# Patient Record
Sex: Male | Born: 1965 | Race: Black or African American | Hispanic: No | Marital: Single | Smoking: Former smoker
Health system: Southern US, Community
[De-identification: ages and names within clinical notes are randomized; demographics above are authoritative.]

## PROBLEM LIST (undated history)

## (undated) DIAGNOSIS — M5441 Lumbago with sciatica, right side: Secondary | ICD-10-CM

## (undated) DIAGNOSIS — B20 Human immunodeficiency virus [HIV] disease: Secondary | ICD-10-CM

## (undated) DIAGNOSIS — B009 Herpesviral infection, unspecified: Secondary | ICD-10-CM

## (undated) DIAGNOSIS — E119 Type 2 diabetes mellitus without complications: Secondary | ICD-10-CM

## (undated) DIAGNOSIS — G8929 Other chronic pain: Secondary | ICD-10-CM

## (undated) DIAGNOSIS — Z21 Asymptomatic human immunodeficiency virus [HIV] infection status: Secondary | ICD-10-CM

## (undated) DIAGNOSIS — I10 Essential (primary) hypertension: Secondary | ICD-10-CM

---

## 2011-06-01 DIAGNOSIS — B009 Herpesviral infection, unspecified: Secondary | ICD-10-CM | POA: Insufficient documentation

## 2011-06-01 DIAGNOSIS — Z227 Latent tuberculosis: Secondary | ICD-10-CM | POA: Insufficient documentation

## 2011-11-22 DIAGNOSIS — M545 Low back pain, unspecified: Secondary | ICD-10-CM | POA: Insufficient documentation

## 2014-01-19 DIAGNOSIS — I10 Essential (primary) hypertension: Secondary | ICD-10-CM | POA: Insufficient documentation

## 2015-12-10 ENCOUNTER — Encounter: Payer: Self-pay | Admitting: *Deleted

## 2015-12-10 ENCOUNTER — Emergency Department
Admission: EM | Admit: 2015-12-10 | Discharge: 2015-12-10 | Disposition: A | Discharge status: Home / Self Care | Payer: Self-pay | Source: Home / Self Care | Attending: Family Medicine | Admitting: Family Medicine

## 2015-12-10 DIAGNOSIS — R030 Elevated blood-pressure reading, without diagnosis of hypertension: Secondary | ICD-10-CM

## 2015-12-10 DIAGNOSIS — R3589 Other polyuria: Secondary | ICD-10-CM

## 2015-12-10 DIAGNOSIS — IMO0001 Reserved for inherently not codable concepts without codable children: Secondary | ICD-10-CM

## 2015-12-10 DIAGNOSIS — R358 Other polyuria: Secondary | ICD-10-CM

## 2015-12-10 DIAGNOSIS — R631 Polydipsia: Secondary | ICD-10-CM

## 2015-12-10 DIAGNOSIS — E119 Type 2 diabetes mellitus without complications: Secondary | ICD-10-CM

## 2015-12-10 HISTORY — DX: Herpesviral infection, unspecified: B00.9

## 2015-12-10 HISTORY — DX: Human immunodeficiency virus (HIV) disease: B20

## 2015-12-10 HISTORY — DX: Asymptomatic human immunodeficiency virus (hiv) infection status: Z21

## 2015-12-10 HISTORY — DX: Essential (primary) hypertension: I10

## 2015-12-10 LAB — POCT URINALYSIS DIP (MANUAL ENTRY)
Bilirubin, UA: NEGATIVE
Blood, UA: NEGATIVE
Glucose, UA: 500 — AB
Leukocytes, UA: NEGATIVE
Nitrite, UA: NEGATIVE
Protein Ur, POC: NEGATIVE
Spec Grav, UA: <=1.005 (ref 1.005–1.03)
Urobilinogen, UA: 0.2 (ref 0–1)
pH, UA: 5 (ref 5–8)

## 2015-12-10 LAB — POCT CBC W AUTO DIFF (K'VILLE URGENT CARE)

## 2015-12-10 LAB — POCT FASTING CBG KUC MANUAL ENTRY

## 2015-12-10 MED ORDER — METFORMIN HCL 500 MG PO TABS: 500.0000 mg | ORAL_TABLET | Freq: Two times a day (BID) | ORAL | Status: DC

## 2015-12-10 NOTE — ED Notes (Signed)
Pt c/o urinary frequency and increased thirst x 1 wk. Denies dysuria.

## 2015-12-10 NOTE — ED Provider Notes (Signed)
CSN: [id number]     Arrival date & time 12/10/15  1456 History   First MD Initiated Contact with Patient 12/10/15 1502     Chief Complaint  Patient presents with  . Urinary Frequency   (Consider location/radiation/quality/duration/timing/severity/associated sxs/prior Treatment) HPI  The pt is a 50yo male presenting to [facility] with concern he might have diabetes. Pt reports having increased thirst and urination for the last 1 week. Pt states he will urinate for "like 5 minutes" then have to go away within a few minutes.  He has a hx of HIV and f/u with ID regularly.  Per [facility]where, his last BMP showed normal kidney function and blood glucose of 133.  Pt believes his sister may have diabetes but unsure about the rest of his family.  Denies fever, chills, chest pain, SOB, abdominal pain, nausea, n/v/d. He does report having 3 iced teas PTA while at lunch.  Past Medical History  Diagnosis Date  . HIV (human immunodeficiency virus infection) (HCC)   . Herpes   . Hypertension    History reviewed. No pertinent past surgical history. Family History  Problem Relation Age of Onset  . Hypertension Mother   . Hypertension Father   . Hypertension Sister   . Hypertension Brother    Social History  Substance Use Topics  . Smoking status: Current Every Day Smoker    Types: Cigarettes  . Smokeless tobacco: None  . Alcohol Use: Yes     Comment: 1 q wk    Review of Systems  Constitutional: Negative for fever and chills.  HENT: Negative for congestion, ear pain, sore throat, trouble swallowing and voice change.   Respiratory: Negative for cough and shortness of breath.   Cardiovascular: Negative for chest pain and palpitations.  Gastrointestinal: Negative for nausea, vomiting, abdominal pain and diarrhea.  Endocrine: Positive for polydipsia and polyuria. Negative for polyphagia.  Musculoskeletal: Negative for myalgias, back pain and arthralgias.  Skin: Negative for rash.    Allergies   Review of patient's allergies indicates no known allergies.  Home Medications   Prior to Admission medications   Medication Sig Start Date End Date Taking? Authorizing Provider  Emtricitab-Rilpivir-Tenofovir (COMPLERA PO) Take by mouth.   Yes Historical Provider, MD  hydrochlorothiazide (HYDRODIURIL) 25 MG tablet Take 25 mg by mouth daily.   Yes Historical Provider, MD  valACYclovir (VALTREX) 1000 MG tablet Take 1,000 mg by mouth 2 (two) times daily.   Yes Historical Provider, MD  metFORMIN (GLUCOPHAGE) 500 MG tablet Take 1 tablet (500 mg total) by mouth 2 (two) times daily with a meal. 12/10/15   Junius Finner, PA-C   Meds Ordered and Administered this Visit  Medications - No data to display  BP 155/110 mmHg  Pulse 89  Temp(Src) 98.2 F (36.8 C) (Oral)  Resp 18  Ht  (1.88 m)  Wt 235 lb (106.595 kg)  BMI 30.16 kg/m2  SpO2 96% No data found.   Physical Exam  Constitutional: He appears well-developed and well-nourished.  HENT:  Head: Normocephalic and atraumatic.  Mouth/Throat: Oropharynx is clear and moist.  Eyes: Conjunctivae are normal. No scleral icterus.  Neck: Normal range of motion. Neck supple.  Cardiovascular: Normal rate, regular rhythm and normal heart sounds.   Pulmonary/Chest: Effort normal and breath sounds normal. No respiratory distress. He has no wheezes. He has no rales. He exhibits no tenderness.  Abdominal: Soft. He exhibits no distension. There is no tenderness.  Musculoskeletal: Normal range of motion.  Neurological: He is alert.  Skin: Skin is warm and dry.  Nursing note and vitals reviewed.   ED Course  Procedures (including critical care time)  Labs Review Labs Reviewed  POCT URINALYSIS DIP (MANUAL ENTRY) - Abnormal; Notable for the following:    Glucose, UA =500 (*)    Ketones, POC UA trace (5) (*)    All other components within normal limits  BASIC METABOLIC PANEL WITH GFR  HEMOGLOBIN A1C  POCT FASTING CBG [facility] MANUAL ENTRY  POCT CBC  W AUTO DIFF ([facility])    Imaging Review No results found.   MDM   1. Type 2 diabetes mellitus without complication, without long-term current use of insulin (HCC)   2. Polyuria   3. Polydipsia   4. Elevated blood pressure    Pt c/o polyuria and polydipsia for 1 week.  Elevated BP, hx of HTN.    CBG: >600 UA: glucose = 500, trace ketones  Labs c/w Type II DM Will start pt on Metformin, labs from Feb 2017 pt had normal Creatine  Will send off BMP and Hgb A1c  Encouraged to call Internal Medicine at [facility], where he sees ID, to schedule a f/u appointment to discuss new medication- metformin, and to review today's labs.  Several information packets on Diabetes and diet/exercise provided. Stressed importance for close f/u.  Patient verbalized understanding and agreement with treatment plan.     Junius Finnerrin O'Malley, PA-C 12/10/15 [phone]

## 2015-12-10 NOTE — Discharge Instructions (Signed)
Please be sure to follow up with Internal Medicine at [facility] to discuss today's labs with your sugar being over 600 and sugar seen in your urine.  You should also discuss the new medication, Metformin, which you have been started on today for your high sugar.    Labs including a BMP (to check your kidney function and electrolytes) as well as a Hgb A1c (to check your average blood sugar over the last 90 days) have been sent to the lab.  Those should come back in the next 2-3 days and you will be called with today's results.  Please see below for more information about Type 2 Diabetes.  It is important to have close follow up to help prevent any long term side effects of diabetes.  It is likely your diabetes can be treated with diet and exercises, however, some patients will eventually need to be placed on insulin.  Please review today's information to help you with questions you may have for your next visit with Internal Medicine.

## 2015-12-11 ENCOUNTER — Telehealth: Payer: Self-pay | Admitting: *Deleted

## 2015-12-11 DIAGNOSIS — E119 Type 2 diabetes mellitus without complications: Secondary | ICD-10-CM | POA: Insufficient documentation

## 2015-12-11 LAB — BASIC METABOLIC PANEL WITH GFR
BUN: 16 mg/dL (ref 7–25)
CO2: 21 mmol/L (ref 20–31)
Calcium: 8.8 mg/dL (ref 8.6–10.3)
Chloride: 93 mmol/L — ABNORMAL LOW (ref 98–110)
Creat: 1.11 mg/dL (ref 0.70–1.33)
GFR, Est African American: 89 mL/min (ref >=60)
GFR, Est Non African American: 77 mL/min (ref >=60)
Glucose, Bld: 868 mg/dL (ref 65–99)
Potassium: 5 mmol/L (ref 3.5–5.3)
Sodium: 125 mmol/L — ABNORMAL LOW (ref 135–146)

## 2015-12-11 LAB — HEMOGLOBIN A1C
Hgb A1c MFr Bld: 10.7 % — ABNORMAL HIGH (ref <5.7)
Mean Plasma Glucose: 260 mg/dL

## 2016-08-23 ENCOUNTER — Emergency Department (HOSPITAL_BASED_OUTPATIENT_CLINIC_OR_DEPARTMENT_OTHER)
Admission: EM | Admit: 2016-08-23 | Discharge: 2016-08-23 | Disposition: A | Discharge status: Home / Self Care | Payer: Self-pay | Attending: Emergency Medicine | Admitting: Emergency Medicine

## 2016-08-23 ENCOUNTER — Encounter (HOSPITAL_BASED_OUTPATIENT_CLINIC_OR_DEPARTMENT_OTHER): Payer: Self-pay | Admitting: Emergency Medicine

## 2016-08-23 ENCOUNTER — Emergency Department (HOSPITAL_BASED_OUTPATIENT_CLINIC_OR_DEPARTMENT_OTHER): Payer: Self-pay

## 2016-08-23 DIAGNOSIS — Z7984 Long term (current) use of oral hypoglycemic drugs: Secondary | ICD-10-CM | POA: Insufficient documentation

## 2016-08-23 DIAGNOSIS — Z21 Asymptomatic human immunodeficiency virus [HIV] infection status: Secondary | ICD-10-CM | POA: Insufficient documentation

## 2016-08-23 DIAGNOSIS — M25551 Pain in right hip: Secondary | ICD-10-CM | POA: Insufficient documentation

## 2016-08-23 DIAGNOSIS — Z79899 Other long term (current) drug therapy: Secondary | ICD-10-CM | POA: Insufficient documentation

## 2016-08-23 DIAGNOSIS — E119 Type 2 diabetes mellitus without complications: Secondary | ICD-10-CM | POA: Insufficient documentation

## 2016-08-23 DIAGNOSIS — F17210 Nicotine dependence, cigarettes, uncomplicated: Secondary | ICD-10-CM | POA: Insufficient documentation

## 2016-08-23 DIAGNOSIS — I10 Essential (primary) hypertension: Secondary | ICD-10-CM | POA: Insufficient documentation

## 2016-08-23 HISTORY — DX: Type 2 diabetes mellitus without complications: E11.9

## 2016-08-23 MED ORDER — KETOROLAC TROMETHAMINE 30 MG/ML IJ SOLN
30.0000 mg | Freq: Once | INTRAMUSCULAR | Status: AC
Start: 2016-08-23 — End: 2016-08-23
  Administered 2016-08-23: 30 mg via INTRAMUSCULAR
  Filled 2016-08-23: qty 1

## 2016-08-23 MED ORDER — HYDROCODONE-ACETAMINOPHEN 5-325 MG PO TABS: 1.0000 | ORAL_TABLET | ORAL | 0 refills | Status: DC | PRN

## 2016-08-23 NOTE — ED Triage Notes (Signed)
Patient reports that he has had pain to his right hip for about 1 month. Today it is unbearable. Denies any Trauma or injury

## 2016-08-23 NOTE — Discharge Instructions (Signed)
You hip x-ray was normal. This is likely musculoskeletal pain or arthritic pain. Take the pain medicine as needed. I am giving UA referral to the orthopedist upstairs. Follow-up with them. He may also follow-up with his primary care doctor. Return to the ED if you develop any worsening symptoms.

## 2016-08-23 NOTE — ED Notes (Signed)
States has had right hip pain at times for the past month. Prior episode of similar pain to left shoulder. Denies recent injury

## 2016-08-25 NOTE — ED Provider Notes (Signed)
[facility]   CSN: [id number] Arrival date & time: 08/23/16  1345     History   Chief Complaint Chief Complaint  Patient presents with  . Hip Pain    HPI Maurice Turner is a 51 y.o. male.  Patient presents with no sig pmh for right hip pain. Patient states the pain started approx 5 weeks ago. The pain is intermittent. Moving makes the pain worse at times. Resting makes the pain better. He has not used any otc medication. Denies any trauma or injury. Hx of same pain in his right shoulder that he saw an orthopedists who gave him a steroid injection that helped. Denies any fever, chills, lower extremitiy edema, parathesias, weakness, loss of bowel or bladder, urinary retention, saddle paresthesias.      Past Medical History:  Diagnosis Date  . Diabetes mellitus without complication (HCC)   . Herpes   . HIV (human immunodeficiency virus infection) (HCC)   . Hypertension     There are no active problems to display for this patient.   History reviewed. No pertinent surgical history.     Home Medications    Prior to Admission medications   Medication Sig Start Date End Date Taking? Authorizing Provider  Emtricitab-Rilpivir-Tenofovir (COMPLERA PO) Take by mouth.    Historical Provider, MD  hydrochlorothiazide (HYDRODIURIL) 25 MG tablet Take 25 mg by mouth daily.    Historical Provider, MD  HYDROcodone-acetaminophen (NORCO/VICODIN) 5-325 MG tablet Take 1-2 tablets by mouth every 4 (four) hours as needed. 08/23/16   Rise Mu, PA-C  metFORMIN (GLUCOPHAGE) 500 MG tablet Take 1 tablet (500 mg total) by mouth 2 (two) times daily with a meal. 12/10/15   Junius Finner, PA-C  valACYclovir (VALTREX) 1000 MG tablet Take 1,000 mg by mouth 2 (two) times daily.    Historical Provider, MD    Family History Family History  Problem Relation Age of Onset  . Hypertension Mother   . Hypertension Father   . Hypertension Sister   . Hypertension Brother      Social History Social History  Substance Use Topics  . Smoking status: Current Every Day Smoker    Types: Cigarettes  . Smokeless tobacco: Never Used  . Alcohol use Yes     Comment: 1 q wk     Allergies   Patient has no known allergies.   Review of Systems Review of Systems  Constitutional: Negative for chills and fever.  Respiratory: Negative for cough and shortness of breath.   Cardiovascular: Negative for chest pain and leg swelling.  Gastrointestinal: Negative for nausea and vomiting.  Genitourinary: Negative for difficulty urinating.  Musculoskeletal: Positive for arthralgias. Negative for back pain and gait problem.  Skin: Negative for color change.  Neurological: Negative for weakness and numbness.  All other systems reviewed and are negative.    Physical Exam Updated Vital Signs BP 131/86 (BP Location: Right Arm)   Pulse (!) 58   Temp 98 F (36.7 C) (Oral)   Resp 18   Ht 6\' 2"  (1.88 m)   Wt 97.5 kg   SpO2 98%   BMI 27.60 kg/m   Physical Exam  Constitutional: He appears well-developed and well-nourished. No distress.  Eyes: Right eye exhibits no discharge. Left eye exhibits no discharge. No scleral icterus.  Cardiovascular: Intact distal pulses.   Pulmonary/Chest: No respiratory distress.  Musculoskeletal: Normal range of motion.       Right hip: He exhibits tenderness and bony tenderness. He exhibits normal  range of motion, normal strength, no swelling, no crepitus and no deformity.  Strength 5/5. Cap refill normal. Full ROM. Able to ambulate. Dp pulses are 2+ bilat. No tenderness to midline L spine tenderness. FUll ROM.  Neurological: He is alert.  Reflex Scores:      Patellar reflexes are 2+ on the right side and 2+ on the left side.      Achilles reflexes are 2+ on the right side and 2+ on the left side. Skin: Skin is warm and dry. Capillary refill takes less than 2 seconds. No pallor.  Nursing note and vitals reviewed.    ED Treatments /  Results  Labs (all labs ordered are listed, but only abnormal results are displayed) Labs Reviewed - No data to display  EKG  EKG Interpretation None       Radiology Dg Hip Unilat W Or Wo Pelvis 2-3 Views Right  Result Date: 08/23/2016 CLINICAL DATA:  Right hip pain.  No injury. EXAM: DG HIP (WITH OR WITHOUT PELVIS) 2-3V RIGHT COMPARISON:  None. FINDINGS: Mild joint space narrowing in the hip joints bilaterally. SI joints are symmetric and unremarkable. No acute bony abnormality. Specifically, no fracture, subluxation, or dislocation. Soft tissues are intact. IMPRESSION: No acute bony abnormality. Electronically Signed   By: Charlett NoseKevin  Dover M.D.   On: 08/23/2016 14:17    Procedures Procedures (including critical care time)  Medications Ordered in ED Medications  ketorolac (TORADOL) 30 MG/ML injection 30 mg (30 mg Intramuscular Given 08/23/16 1501)     Initial Impression / Assessment and Plan / ED Course  I have reviewed the triage vital signs and the nursing notes.  Pertinent labs & imaging results that were available during my care of the patient were reviewed by me and considered in my medical decision making (see chart for details).  Clinical Course   Patient X-Ray negative for obvious fracture or dislocation. Pain managed in ED. Pt advised to follow up with orthopedics if symptoms persist for possibility of missed fracture diagnosis. Patient given brace while in ED, conservative therapy recommended and discussed. Patient will be dc home & is agreeable with above plan.  Final Clinical Impressions(s) / ED Diagnoses   Final diagnoses:  Right hip pain    New Prescriptions Discharge Medication List as of 08/23/2016  2:57 PM    START taking these medications   Details  HYDROcodone-acetaminophen (NORCO/VICODIN) 5-325 MG tablet Take 1-2 tablets by mouth every 4 (four) hours as needed., Starting Sun 08/23/2016, Print         Rise MuKenneth T Orion Mole, PA-C 08/27/16 1348      Rolland PorterMark James, MD 09/10/16 [phone]

## 2016-08-27 ENCOUNTER — Ambulatory Visit (INDEPENDENT_AMBULATORY_CARE_PROVIDER_SITE_OTHER): Payer: BLUE CROSS/BLUE SHIELD | Admitting: Family Medicine

## 2016-08-27 ENCOUNTER — Encounter: Payer: Self-pay | Admitting: Family Medicine

## 2016-08-27 DIAGNOSIS — M5441 Lumbago with sciatica, right side: Secondary | ICD-10-CM

## 2016-08-27 MED ORDER — METHOCARBAMOL 500 MG PO TABS
500.0000 mg | ORAL_TABLET | Freq: Three times a day (TID) | ORAL | 1 refills | Status: DC | PRN
Start: 2016-08-27 — End: 2017-03-18

## 2016-08-27 MED ORDER — OXYCODONE-ACETAMINOPHEN 7.5-325 MG PO TABS: 1.0000 | ORAL_TABLET | Freq: Four times a day (QID) | ORAL | 0 refills | Status: DC | PRN

## 2016-08-27 MED ORDER — PREDNISONE 10 MG PO TABS: ORAL_TABLET | ORAL | 0 refills | Status: DC

## 2016-08-27 NOTE — Patient Instructions (Addendum)
You have lumbar radiculopathy (a pinched nerve in your low back). A prednisone dose pack is the best option for immediate relief and may be prescribed. Day after finishing prednisone start aleve 2 tabs twice a day with food for pain and inflammation. Oxycodone as needed for severe pain (no driving on this medicine). Robaxin as needed for muscle spasms (no driving on this medicine if it makes you sleepy). Stay as active as possible. Physical therapy has been shown to be helpful as well. Strengthening of low back muscles, abdominal musculature are key for long term pain relief. If not improving, will consider further imaging (MRI). Call me in a week to let me know how you're doing.

## 2016-08-31 ENCOUNTER — Telehealth: Payer: Self-pay | Admitting: Family Medicine

## 2016-08-31 DIAGNOSIS — B20 Human immunodeficiency virus [HIV] disease: Secondary | ICD-10-CM | POA: Insufficient documentation

## 2016-08-31 NOTE — Assessment & Plan Note (Signed)
history consistent with radiculopathy - only finding on exam is positive straight leg raise.  He will try prednisone dose pack with robaxin and oxycodone as needed.  Call us in a week for an update on his status.  Consider PT if improving, MRI if not improving.

## 2016-08-31 NOTE — Telephone Encounter (Signed)
Spoke to patient and he stated that he was doing better today (08-31-16). Stated he is taking the Prednisone. Told him that if not improving then we could do a MRI. Patient stated that he wanted to wait since he was not having pain today.

## 2016-08-31 NOTE — Telephone Encounter (Signed)
Is he taking the prednisone?  If he's taking the prednisone and still not improving then would consider imaging of his hip (MRI though if x-rays required before this he would need lumbar spine x-rays).

## 2016-08-31 NOTE — Progress Notes (Signed)
PCP: Pcp Not In System  Subjective:   HPI: Patient is a 51 y.o. male here for low back pain.  Patient reports he's had about 1 month of severe right sided low back, hip pain. Pain starts back on right side, worse with rotation. Can radiate down to right foot. Difficulty walking at times. Given toradol in ED which helped transiently. Pain is 10/10, sharp. Worse with squatting, bending, going from sitting to standing. Tried hydrocodone, ibuprofen. Occasional numbness down to knee. No bowel/bladder dysfunction. No skin changes.  Past Medical History:  Diagnosis Date  . Diabetes mellitus without complication (HCC)   . Herpes   . HIV (human immunodeficiency virus infection) (HCC)   . Hypertension     Current Outpatient Prescriptions on File Prior to Visit  Medication Sig Dispense Refill  . Emtricitab-Rilpivir-Tenofovir (COMPLERA PO) Take by mouth.    . hydrochlorothiazide (HYDRODIURIL) 25 MG tablet Take 25 mg by mouth daily.     No current facility-administered medications on file prior to visit.     No past surgical history on file.  No Known Allergies  Social History   Social History  . Marital status: Single    Spouse name: N/A  . Number of children: N/A  . Years of education: N/A   Occupational History  . Not on file.   Social History Main Topics  . Smoking status: Current Every Day Smoker    Types: Cigarettes  . Smokeless tobacco: Never Used  . Alcohol use Yes     Comment: 1 q wk  . Drug use:     Types: Marijuana  . Sexual activity: Not on file   Other Topics Concern  . Not on file   Social History Narrative  . No narrative on file    Family History  Problem Relation Age of Onset  . Hypertension Mother   . Hypertension Father   . Hypertension Sister   . Hypertension Brother     BP (!) 155/97   Pulse 62   Ht 6\' 2"  (1.88 m)   Wt 220 lb (99.8 kg)   BMI 28.25 kg/m   Review of Systems: See HPI above.     Objective:  Physical  Exam:  Gen: NAD, comfortable in exam room  Back: No gross deformity, scoliosis. No paraspinal TTP .  No midline or bony TTP. FROM with pain on trunk rotation. Strength LEs 5/5 all muscle groups.   2+ MSRs in patellar and achilles tendons, equal bilaterally. Positive SLR on right, negative left. Sensation intact to light touch bilaterally. Negative logroll bilateral hips Negative fabers and piriformis stretches.   Assessment & Plan:  1. Lumbar radiculopathy - history consistent with radiculopathy - only finding on exam is positive straight leg raise.  He will try prednisone dose pack with robaxin and oxycodone as needed.  Call us in a week for an update on his status.  Consider PT if improving, MRI if not improving.

## 2016-09-06 ENCOUNTER — Encounter (HOSPITAL_BASED_OUTPATIENT_CLINIC_OR_DEPARTMENT_OTHER): Payer: Self-pay | Admitting: *Deleted

## 2016-09-06 ENCOUNTER — Emergency Department (HOSPITAL_BASED_OUTPATIENT_CLINIC_OR_DEPARTMENT_OTHER)
Admission: EM | Admit: 2016-09-06 | Discharge: 2016-09-06 | Disposition: A | Discharge status: Home / Self Care | Payer: BLUE CROSS/BLUE SHIELD | Attending: Emergency Medicine | Admitting: Emergency Medicine

## 2016-09-06 DIAGNOSIS — Z7984 Long term (current) use of oral hypoglycemic drugs: Secondary | ICD-10-CM | POA: Insufficient documentation

## 2016-09-06 DIAGNOSIS — Z21 Asymptomatic human immunodeficiency virus [HIV] infection status: Secondary | ICD-10-CM | POA: Diagnosis not present

## 2016-09-06 DIAGNOSIS — M25551 Pain in right hip: Secondary | ICD-10-CM | POA: Diagnosis present

## 2016-09-06 DIAGNOSIS — F17210 Nicotine dependence, cigarettes, uncomplicated: Secondary | ICD-10-CM | POA: Insufficient documentation

## 2016-09-06 DIAGNOSIS — I10 Essential (primary) hypertension: Secondary | ICD-10-CM | POA: Insufficient documentation

## 2016-09-06 DIAGNOSIS — E119 Type 2 diabetes mellitus without complications: Secondary | ICD-10-CM | POA: Diagnosis not present

## 2016-09-06 MED ORDER — MELOXICAM 7.5 MG PO TABS
7.5000 mg | ORAL_TABLET | Freq: Every day | ORAL | Status: DC
  Filled 2016-09-06: qty 1

## 2016-09-06 MED ORDER — MELOXICAM 7.5 MG PO TABS: 15.0000 mg | ORAL_TABLET | Freq: Every day | ORAL | 0 refills | Status: DC

## 2016-09-06 NOTE — ED Triage Notes (Addendum)
Pt c/o right hip pain , brisk walk w/o difficulty to triage. Pt is requesting refill of  pain meds , seen here 2 weeks ago for same and f/u with [facility]

## 2016-09-06 NOTE — ED Provider Notes (Signed)
[facility] Note   CSN: [id number] Arrival date & time: 09/06/16  8657     History   Chief Complaint Chief Complaint  Patient presents with  . Hip Pain    HPI Maurice Turner is a 51 y.o. male.  Patient presents emergency department with chief complaint of right hip pain. He states he has had the pain for the past couple of months. He denies any known injuries. States that he has some pain that radiates from his back to the right hip. He was seen on 08/23/16 for the same, given pain medicine, antibiotics to follow-up with primary care. He denies any changes in his symptoms, but states that he has run out of his pain medication, and is requesting a refill. Denies any numbness, weakness, tingling, bowel or bladder incontinence. He has an appointment with Dr. Andree Coss tomorrow.    Hip Pain     Past Medical History:  Diagnosis Date  . Diabetes mellitus without complication (HCC)   . Herpes   . HIV (human immunodeficiency virus infection) (HCC)   . Hypertension     Patient Active Problem List   Diagnosis Date Noted  . HIV disease (HCC) 08/31/2016  . Type 2 diabetes mellitus without complication (HCC) 12/11/2015  . Essential hypertension with goal blood pressure less than 130/80 01/19/2014  . Low back pain 11/22/2011  . Herpes simplex type 2 infection 06/01/2011  . 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 No pertinent surgical history.     Home Medications    Prior to Admission medications   Medication Sig Start Date End Date Taking? Authorizing Provider  Emtricitab-Rilpivir-Tenofovir (COMPLERA PO) Take by mouth.    Historical Provider, MD  emtricitabine-rilpivir-tenofovir AF (ODEFSEY) 200-25-25 MG TABS tablet TAKE 1 TABLET BY MOUTH DAILY 05/25/16   Historical Provider, MD  hydrochlorothiazide (HYDRODIURIL) 25 MG tablet Take 25 mg by mouth daily.    Historical Provider, MD  lisinopril (PRINIVIL,ZESTRIL) 10 MG tablet Take 10  mg by mouth. 12/12/15 12/11/16  Historical Provider, MD  meloxicam (MOBIC) 7.5 MG tablet Take 2 tablets (15 mg total) by mouth daily. 09/06/16   Roxy Horseman, PA-C  metFORMIN (GLUCOPHAGE) 500 MG tablet Take 500 mg by mouth. 06/02/16   Historical Provider, MD  methocarbamol (ROBAXIN) 500 MG tablet Take 1 tablet (500 mg total) by mouth every 8 (eight) hours as needed. 08/27/16   Lenda Kelp, MD  oxyCODONE-acetaminophen (PERCOCET) 7.5-325 MG tablet Take 1 tablet by mouth every 6 (six) hours as needed for severe pain. 08/27/16   Lenda Kelp, MD  predniSONE (DELTASONE) 10 MG tablet 6 tabs po day 1, 5 tabs po day 2, 4 tabs po day 3, 3 tabs po day 4, 2 tabs po day 5, 1 tab po day 6 08/27/16   Lenda Kelp, MD  valACYclovir (VALTREX) 1000 MG tablet TAKE 1 TABLET BY MOUTH DAILY 08/19/16   Historical Provider, MD    Family History Family History  Problem Relation Age of Onset  . Hypertension Mother   . Hypertension Father   . Hypertension Sister   . Hypertension Brother     Social History Social History  Substance Use Topics  . Smoking status: Current Every Day Smoker    Types: Cigarettes  . Smokeless tobacco: Never Used  . Alcohol use Yes     Comment: 1 q wk     Allergies   Patient has no known allergies.   Review of Systems Review of  Systems  Musculoskeletal: Positive for arthralgias.  All other systems reviewed and are negative.    Physical Exam Updated Vital Signs BP 145/89   Pulse (!) 55   Temp 98.2 F (36.8 C)   Resp 18   Ht 6\' 2"  (1.88 m)   Wt 99.8 kg   SpO2 100%   BMI 28.25 kg/m   Physical Exam  Constitutional: He is oriented to person, place, and time. No distress.  HENT:  Head: Normocephalic and atraumatic.  Eyes: Conjunctivae and EOM are normal. Pupils are equal, round, and reactive to light.  Neck: No tracheal deviation present.  Cardiovascular: Normal rate.   Pulmonary/Chest: Effort normal. No respiratory distress.  Abdominal: Soft.    Musculoskeletal: Normal range of motion.  No difficulty ambulating ROM and strength of lower extremities is 5/5  Neurological: He is alert and oriented to person, place, and time.  Sensation intact  Skin: Skin is warm and dry. He is not diaphoretic.  Psychiatric: Judgment normal.  Nursing note and vitals reviewed.    ED Treatments / Results  Labs (all labs ordered are listed, but only abnormal results are displayed) Labs Reviewed - No data to display  EKG  EKG Interpretation None       Radiology No results found.  Procedures Procedures (including critical care time)  Medications Ordered in ED Medications  meloxicam (MOBIC) tablet 7.5 mg (not administered)     Initial Impression / Assessment and Plan / ED Course  I have reviewed the triage vital signs and the nursing notes.  Pertinent labs & imaging results that were available during my care of the patient were reviewed by me and considered in my medical decision making (see chart for details).  Clinical Course     Patient with right hip pain. Seen last week for the same. Has had pain for the past 6 weeks or so. Plain films from last week were negative. I'm much more suspicious of a lumbar radiculopathy versus sciatica. Patient has follow-up with sports medicine tomorrow. He is requesting narcotic pain medication today, threatened lawsuit if he does not get this medicine. I informed him that I'm happy to treat his pain as much as possible, but without narcotic pain medication. Will try Mobic. Advised patient not to take any other NSAIDs with this medication. I'm only giving him a one-week course since he will be seeing sports medicine tomorrow. I do not feel that narcotic pain medicine is indicated in this patient do not feel that it would be in the patient's overall interest and health.  Final Clinical Impressions(s) / ED Diagnoses   Final diagnoses:  Right hip pain    New Prescriptions New Prescriptions    MELOXICAM (MOBIC) 7.5 MG TABLET    Take 2 tablets (15 mg total) by mouth daily.     Roxy HorsemanRobert Thessaly Mccullers, PA-C 09/06/16 1027    Charlynne Panderavid Hsienta Yao, MD 09/06/16 2015

## 2016-09-06 NOTE — ED Notes (Signed)
amb w/o difficulty to checkout

## 2016-09-07 ENCOUNTER — Ambulatory Visit (HOSPITAL_BASED_OUTPATIENT_CLINIC_OR_DEPARTMENT_OTHER)
Admission: RE | Admit: 2016-09-07 | Discharge: 2016-09-07 | Disposition: A | Discharge status: Home / Self Care | Payer: BLUE CROSS/BLUE SHIELD | Source: Ambulatory Visit | Attending: Family Medicine | Admitting: Family Medicine

## 2016-09-07 ENCOUNTER — Encounter: Payer: Self-pay | Admitting: Family Medicine

## 2016-09-07 ENCOUNTER — Ambulatory Visit (INDEPENDENT_AMBULATORY_CARE_PROVIDER_SITE_OTHER): Payer: BLUE CROSS/BLUE SHIELD | Admitting: Family Medicine

## 2016-09-07 VITALS — BP 163/103 | HR 68 | Ht 73.0 in | Wt 215.0 lb

## 2016-09-07 DIAGNOSIS — M5441 Lumbago with sciatica, right side: Secondary | ICD-10-CM

## 2016-09-07 DIAGNOSIS — M79604 Pain in right leg: Secondary | ICD-10-CM | POA: Diagnosis not present

## 2016-09-07 DIAGNOSIS — M1288 Other specific arthropathies, not elsewhere classified, other specified site: Secondary | ICD-10-CM | POA: Diagnosis not present

## 2016-09-07 MED ORDER — DICLOFENAC SODIUM 75 MG PO TBEC: 75.0000 mg | DELAYED_RELEASE_TABLET | Freq: Two times a day (BID) | ORAL | 0 refills | Status: DC

## 2016-09-07 MED ORDER — CYCLOBENZAPRINE HCL 10 MG PO TABS: 10.0000 mg | ORAL_TABLET | Freq: Every evening | ORAL | 1 refills | Status: DC | PRN

## 2016-09-07 NOTE — Patient Instructions (Addendum)
You have lumbar radiculopathy (a pinched nerve in your low back). We will go ahead with an MRI of your lumbar spine. STOP the meloxicam and prednisone. Try diclofenac twice a day with food for pain and inflammation. Flexeril as needed for muscle spasms (no driving on this medicine if it makes you sleepy). We do not refill the oxycodone you were prescribed last visit. Stay as active as possible.

## 2016-09-08 NOTE — Assessment & Plan Note (Signed)
history consistent with radiculopathy - again only finding on exam is positive straight leg raise.  Unfortunately did not improve with prednisone, robaxin, oxycodone.  Will go ahead with MRI.  Try diclofenac, flexeril instead in meantime.  Consider ESIs, neurosurgery referral depending on MRI results.

## 2016-09-08 NOTE — Progress Notes (Addendum)
PCP: Pcp Not In System  Subjective:   HPI: Patient is a 51 y.o. male here for low back pain.  1/4: Patient reports he's had about 1 month of severe right sided low back, hip pain. Pain starts back on right side, worse with rotation. Can radiate down to right foot. Difficulty walking at times. Given toradol in ED which helped transiently. Pain is 10/10, sharp. Worse with squatting, bending, going from sitting to standing. Tried hydrocodone, ibuprofen. Occasional numbness down to knee. No bowel/bladder dysfunction. No skin changes.  1/15: Patient reports no improvement with prednisone, robaxin, oxycodone. He went to ED - tried meloxicam which did help some. Pain radiates from right side of hip down into all toes. Associated numbness through this distribution too. Pain is 10/10, worse by end of day, sharp. Worse with sitting then standing. No bowel/bladder dysfunction. No skin changes.  Past Medical History:  Diagnosis Date  . Diabetes mellitus without complication (HCC)   . Herpes   . HIV (human immunodeficiency virus infection) (HCC)   . Hypertension     Current Outpatient Prescriptions on File Prior to Visit  Medication Sig Dispense Refill  . Emtricitab-Rilpivir-Tenofovir (COMPLERA PO) Take by mouth.    Marland Kitchen emtricitabine-rilpivir-tenofovir AF (ODEFSEY) 200-25-25 MG TABS tablet TAKE 1 TABLET BY MOUTH DAILY    . hydrochlorothiazide (HYDRODIURIL) 25 MG tablet Take 25 mg by mouth daily.    Marland Kitchen lisinopril (PRINIVIL,ZESTRIL) 10 MG tablet Take 10 mg by mouth.    . metFORMIN (GLUCOPHAGE) 500 MG tablet Take 500 mg by mouth.    . methocarbamol (ROBAXIN) 500 MG tablet Take 1 tablet (500 mg total) by mouth every 8 (eight) hours as needed. 60 tablet 1  . oxyCODONE-acetaminophen (PERCOCET) 7.5-325 MG tablet Take 1 tablet by mouth every 6 (six) hours as needed for severe pain. 20 tablet 0  . predniSONE (DELTASONE) 10 MG tablet 6 tabs po day 1, 5 tabs po day 2, 4 tabs po day 3, 3 tabs po  day 4, 2 tabs po day 5, 1 tab po day 6 21 tablet 0  . valACYclovir (VALTREX) 1000 MG tablet TAKE 1 TABLET BY MOUTH DAILY     No current facility-administered medications on file prior to visit.     No past surgical history on file.  No Known Allergies  Social History   Social History  . Marital status: Single    Spouse name: N/A  . Number of children: N/A  . Years of education: N/A   Occupational History  . Not on file.   Social History Main Topics  . Smoking status: Current Every Day Smoker    Types: Cigarettes  . Smokeless tobacco: Never Used  . Alcohol use Yes     Comment: 1 q wk  . Drug use:     Types: Marijuana  . Sexual activity: Not on file   Other Topics Concern  . Not on file   Social History Narrative  . No narrative on file    Family History  Problem Relation Age of Onset  . Hypertension Mother   . Hypertension Father   . Hypertension Sister   . Hypertension Brother     BP (!) 163/103   Pulse 68   Ht 6\' 1"  (1.854 m)   Wt 215 lb (97.5 kg)   BMI 28.37 kg/m   Review of Systems: See HPI above.     Objective:  Physical Exam:  Gen: NAD, comfortable in exam room  Back/right hip: No gross deformity,  scoliosis. No paraspinal TTP.  No midline or bony TTP.  No hip tenderness including trochanter. FROM. Strength LEs 5/5 all muscle groups.   2+ MSRs in patellar and achilles tendons, equal bilaterally. Positive SLR on right, negative left. Sensation intact to light touch bilaterally. Negative logroll bilateral hips   Assessment & Plan:  1. Lumbar radiculopathy - history consistent with radiculopathy - again only finding on exam is positive straight leg raise.  Unfortunately did not improve with prednisone, robaxin, oxycodone.  Will go ahead with MRI.  Try diclofenac, flexeril instead in meantime.  Consider ESIs, neurosurgery referral depending on MRI results.  Addendum:  MRI reviewed and discussed with patient.  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  He reports past few days he's been doing better - he is going to see if this continues and if not give us a call to try ESI on right at L4-5.  Addendum:  Patient called on 1/25 requested go ahead with ESI.  Order placed.  Also requested pain medication - advised cannot refill oxycodone.  Offered tramadol after reviewed [place] narcotic database, only 20 tablets.

## 2016-09-12 ENCOUNTER — Ambulatory Visit (HOSPITAL_BASED_OUTPATIENT_CLINIC_OR_DEPARTMENT_OTHER)
Admission: RE | Admit: 2016-09-12 | Discharge: 2016-09-12 | Disposition: A | Discharge status: Home / Self Care | Payer: BLUE CROSS/BLUE SHIELD | Source: Ambulatory Visit | Attending: Family Medicine | Admitting: Family Medicine

## 2016-09-12 DIAGNOSIS — M79604 Pain in right leg: Secondary | ICD-10-CM

## 2016-09-12 DIAGNOSIS — M5126 Other intervertebral disc displacement, lumbar region: Secondary | ICD-10-CM | POA: Insufficient documentation

## 2016-09-12 DIAGNOSIS — M48061 Spinal stenosis, lumbar region without neurogenic claudication: Secondary | ICD-10-CM | POA: Diagnosis not present

## 2016-09-18 ENCOUNTER — Emergency Department (HOSPITAL_BASED_OUTPATIENT_CLINIC_OR_DEPARTMENT_OTHER)
Admission: EM | Admit: 2016-09-18 | Discharge: 2016-09-19 | Disposition: A | Discharge status: Home / Self Care | Payer: BLUE CROSS/BLUE SHIELD | Attending: Emergency Medicine | Admitting: Emergency Medicine

## 2016-09-18 ENCOUNTER — Encounter (HOSPITAL_BASED_OUTPATIENT_CLINIC_OR_DEPARTMENT_OTHER): Payer: Self-pay | Admitting: *Deleted

## 2016-09-18 ENCOUNTER — Telehealth (HOSPITAL_BASED_OUTPATIENT_CLINIC_OR_DEPARTMENT_OTHER): Payer: Self-pay | Admitting: *Deleted

## 2016-09-18 DIAGNOSIS — M5416 Radiculopathy, lumbar region: Secondary | ICD-10-CM | POA: Diagnosis not present

## 2016-09-18 DIAGNOSIS — Z7984 Long term (current) use of oral hypoglycemic drugs: Secondary | ICD-10-CM | POA: Insufficient documentation

## 2016-09-18 DIAGNOSIS — Z79899 Other long term (current) drug therapy: Secondary | ICD-10-CM | POA: Insufficient documentation

## 2016-09-18 DIAGNOSIS — F1290 Cannabis use, unspecified, uncomplicated: Secondary | ICD-10-CM | POA: Diagnosis not present

## 2016-09-18 DIAGNOSIS — F17210 Nicotine dependence, cigarettes, uncomplicated: Secondary | ICD-10-CM | POA: Insufficient documentation

## 2016-09-18 DIAGNOSIS — E119 Type 2 diabetes mellitus without complications: Secondary | ICD-10-CM | POA: Diagnosis not present

## 2016-09-18 DIAGNOSIS — Z21 Asymptomatic human immunodeficiency virus [HIV] infection status: Secondary | ICD-10-CM | POA: Insufficient documentation

## 2016-09-18 DIAGNOSIS — I10 Essential (primary) hypertension: Secondary | ICD-10-CM | POA: Insufficient documentation

## 2016-09-18 DIAGNOSIS — M545 Low back pain: Secondary | ICD-10-CM | POA: Diagnosis present

## 2016-09-18 HISTORY — DX: Other chronic pain: G89.29

## 2016-09-18 HISTORY — DX: Lumbago with sciatica, right side: M54.41

## 2016-09-18 MED ORDER — TRAMADOL HCL 50 MG PO TABS: 50.0000 mg | ORAL_TABLET | Freq: Four times a day (QID) | ORAL | 0 refills | Status: DC | PRN

## 2016-09-18 NOTE — Telephone Encounter (Signed)
Pt called with questions regarding medication administered at past ED visit. Pt c/o continued pain and wanted to know if we could "give him that shot again". Advised pt he could come at any time to be evaluated but it would be up to the provider to decide what medications if any would be given. Pt voiced understanding.

## 2016-09-18 NOTE — ED Notes (Signed)
Pt reports sudden increase in R hip pain today. Pt being followed by Dr. Pearletha ForgeHudnall for same. Pt states today he has taken 7.5 Vicodin today, Flexeril, and Robaxin today without relief. Pt requesting Toradol injection for pain relief.

## 2016-09-18 NOTE — Addendum Note (Signed)
Addended by: Lenda KelpHUDNALL, SHANE R on: 09/18/2016 08:21 AM   Modules accepted: Orders

## 2016-09-18 NOTE — ED Triage Notes (Signed)
Pt has percocet, tramadol and flexeril all prescribed this month.

## 2016-09-18 NOTE — ED Triage Notes (Signed)
Right hip pain. States he has a pinched nerve. He needs a shot.

## 2016-09-18 NOTE — ED Notes (Signed)
Pt states he received a shot of Toradol and it helped a lot.

## 2016-09-19 ENCOUNTER — Encounter (HOSPITAL_BASED_OUTPATIENT_CLINIC_OR_DEPARTMENT_OTHER): Payer: Self-pay | Admitting: Emergency Medicine

## 2016-09-19 MED ORDER — NAPROXEN 500 MG PO TABS
500.0000 mg | ORAL_TABLET | Freq: Two times a day (BID) | ORAL | 0 refills | Status: DC
Start: 2016-09-19 — End: 2017-03-19

## 2016-09-19 MED ORDER — OXYCODONE-ACETAMINOPHEN 5-325 MG PO TABS
1.0000 | ORAL_TABLET | Freq: Once | ORAL | Status: AC
  Administered 2016-09-19: 1 via ORAL
  Filled 2016-09-19: qty 1

## 2016-09-19 MED ORDER — OMEPRAZOLE 20 MG PO CPDR: 20.0000 mg | DELAYED_RELEASE_CAPSULE | Freq: Every day | ORAL | 0 refills | Status: DC

## 2016-09-19 MED ORDER — KETOROLAC TROMETHAMINE 60 MG/2ML IM SOLN
60.0000 mg | Freq: Once | INTRAMUSCULAR | Status: AC
Start: 2016-09-19 — End: 2016-09-19
  Administered 2016-09-19: 60 mg via INTRAMUSCULAR
  Filled 2016-09-19: qty 2

## 2016-09-19 NOTE — ED Notes (Signed)
Pt verbalizes understanding of d/c instructions and denies any further needs at this time. 

## 2016-09-19 NOTE — ED Provider Notes (Signed)
[facility]   CSN: [id number] Arrival date & time: 09/18/16  2015     History   Chief Complaint Chief Complaint  Patient presents with  . Hip Pain    HPI Maurice Turner is a 51 y.o. male.  HPI Pt comes in with cc of back pain. Pt has DM, HIV and radicular pain to the lumbar spine. Pt reports that at work he is doing well, but when he gets home and lays down or sits still he starts having severe pain affecting his function and comfort at home. Pt denies any trauma. No associated new numbness, weakness, urinary incontinence, urinary retention, bowel incontinence, weakness.    Past Medical History:  Diagnosis Date  . Chronic right-sided low back pain with right-sided sciatica   . Diabetes mellitus without complication (HCC)   . Herpes   . HIV (human immunodeficiency virus infection) (HCC)   . Hypertension     Patient Active Problem List   Diagnosis Date Noted  . HIV disease (HCC) 08/31/2016  . Type 2 diabetes mellitus without complication (HCC) 12/11/2015  . Essential hypertension with goal blood pressure less than 130/80 01/19/2014  . Low back pain 11/22/2011  . Herpes simplex type 2 infection 06/01/2011  . 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 No pertinent surgical history.     Home Medications    Prior to Admission medications   Medication Sig Start Date End Date Taking? Authorizing Provider  cyclobenzaprine (FLEXERIL) 10 MG tablet Take 1 tablet (10 mg total) by mouth at bedtime as needed for muscle spasms. 09/07/16   Lenda Kelp, MD  diclofenac (VOLTAREN) 75 MG EC tablet Take 1 tablet (75 mg total) by mouth 2 (two) times daily. 09/07/16   Lenda Kelp, MD  Emtricitab-Rilpivir-Tenofovir (COMPLERA PO) Take by mouth.    Historical Provider, MD  emtricitabine-rilpivir-tenofovir AF (ODEFSEY) 200-25-25 MG TABS tablet TAKE 1 TABLET BY MOUTH DAILY 05/25/16   Historical Provider, MD  hydrochlorothiazide  (HYDRODIURIL) 25 MG tablet Take 25 mg by mouth daily.    Historical Provider, MD  lisinopril (PRINIVIL,ZESTRIL) 10 MG tablet Take 10 mg by mouth. 12/12/15 12/11/16  Historical Provider, MD  metFORMIN (GLUCOPHAGE) 500 MG tablet Take 500 mg by mouth. 06/02/16   Historical Provider, MD  methocarbamol (ROBAXIN) 500 MG tablet Take 1 tablet (500 mg total) by mouth every 8 (eight) hours as needed. 08/27/16   Lenda Kelp, MD  naproxen (NAPROSYN) 500 MG tablet Take 1 tablet (500 mg total) by mouth 2 (two) times daily. 09/19/16   Derwood Kaplan, MD  omeprazole (PRILOSEC) 20 MG capsule Take 1 capsule (20 mg total) by mouth daily. 09/19/16   Derwood Kaplan, MD  oxyCODONE-acetaminophen (PERCOCET) 7.5-325 MG tablet Take 1 tablet by mouth every 6 (six) hours as needed for severe pain. 08/27/16   Lenda Kelp, MD  predniSONE (DELTASONE) 10 MG tablet 6 tabs po day 1, 5 tabs po day 2, 4 tabs po day 3, 3 tabs po day 4, 2 tabs po day 5, 1 tab po day 6 08/27/16   Lenda Kelp, MD  traMADol (ULTRAM) 50 MG tablet Take 1 tablet (50 mg total) by mouth every 6 (six) hours as needed. 09/18/16   Lenda Kelp, MD  valACYclovir (VALTREX) 1000 MG tablet TAKE 1 TABLET BY MOUTH DAILY 08/19/16   Historical Provider, MD    Family History Family History  Problem Relation Age of Onset  . Hypertension Mother   .  Hypertension Father   . Hypertension Sister   . Hypertension Brother     Social History Social History  Substance Use Topics  . Smoking status: Current Every Day Smoker    Types: Cigarettes  . Smokeless tobacco: Never Used  . Alcohol use Yes     Comment: 1 q wk     Allergies   Patient has no known allergies.   Review of Systems Review of Systems  ROS 10 Systems reviewed and are negative for acute change except as noted in the HPI.     Physical Exam Updated Vital Signs BP 129/80 (BP Location: Right Arm)   Pulse 62   Temp 98.3 F (36.8 C) (Oral)   Resp 16   Ht 6\' 2"  (1.88 m)   Wt 215 lb (97.5  kg)   SpO2 99%   BMI 27.60 kg/m   Physical Exam  Constitutional: He is oriented to person, place, and time. He appears well-developed.  HENT:  Head: Atraumatic.  Neck: Neck supple.  Cardiovascular: Normal rate.   Pulmonary/Chest: Effort normal.  Musculoskeletal: He exhibits tenderness.  Neurological: He is alert and oriented to person, place, and time.  Skin: Skin is warm.  Nursing note and vitals reviewed.    ED Treatments / Results  Labs (all labs ordered are listed, but only abnormal results are displayed) Labs Reviewed - No data to display  EKG  EKG Interpretation None       Radiology No results found.  Procedures Procedures (including critical care time)  Medications Ordered in ED Medications  ketorolac (TORADOL) injection 60 mg (60 mg Intramuscular Given 09/19/16 0031)  oxyCODONE-acetaminophen (PERCOCET/ROXICET) 5-325 MG per tablet 1 tablet (1 tablet Oral Given 09/19/16 0031)     Initial Impression / Assessment and Plan / ED Course  I have reviewed the triage vital signs and the nursing notes.  Pertinent labs & imaging results that were available during my care of the patient were reviewed by me and considered in my medical decision making (see chart for details).     Based on the history of worsening of chronic back pain, MRI that showed radicular process and note from sports medicine doctor showing radicular pathology - we will give pt IM toradol now and d/c him with naprosyn. Exercises given, RICE tx recommended.   Final Clinical Impressions(s) / ED Diagnoses   Final diagnoses:  Lumbar radiculopathy    New Prescriptions New Prescriptions   NAPROXEN (NAPROSYN) 500 MG TABLET    Take 1 tablet (500 mg total) by mouth 2 (two) times daily.   OMEPRAZOLE (PRILOSEC) 20 MG CAPSULE    Take 1 capsule (20 mg total) by mouth daily.     Derwood KaplanAnkit Chang Tiggs, MD 09/19/16 [phone]

## 2016-09-21 ENCOUNTER — Other Ambulatory Visit: Payer: Self-pay | Admitting: Family Medicine

## 2016-09-21 DIAGNOSIS — M5416 Radiculopathy, lumbar region: Secondary | ICD-10-CM

## 2016-09-25 ENCOUNTER — Ambulatory Visit
Admission: RE | Admit: 2016-09-25 | Discharge: 2016-09-25 | Disposition: A | Discharge status: Home / Self Care | Payer: BLUE CROSS/BLUE SHIELD | Source: Ambulatory Visit | Attending: Family Medicine | Admitting: Family Medicine

## 2016-09-25 DIAGNOSIS — M5416 Radiculopathy, lumbar region: Secondary | ICD-10-CM

## 2016-09-25 MED ORDER — IOPAMIDOL (ISOVUE-M 200) INJECTION 41%
1.0000 mL | Freq: Once | INTRAMUSCULAR | Status: AC
  Administered 2016-09-25: 1 mL via EPIDURAL

## 2016-09-25 MED ORDER — METHYLPREDNISOLONE ACETATE 40 MG/ML INJ SUSP (RADIOLOG
120.0000 mg | Freq: Once | INTRAMUSCULAR | Status: AC
  Administered 2016-09-25: 120 mg via EPIDURAL

## 2016-09-25 NOTE — Discharge Instructions (Signed)

## 2016-10-05 ENCOUNTER — Telehealth: Payer: Self-pay | Admitting: *Deleted

## 2016-10-05 NOTE — Telephone Encounter (Signed)
Ok to go ahead - right L4-5 ESI.  Did he get some relief with the first one?

## 2016-10-05 NOTE — Telephone Encounter (Signed)
Patient called and would [facility]

## 2016-10-05 NOTE — Telephone Encounter (Signed)
Order sent for ESI. 

## 2016-10-06 ENCOUNTER — Other Ambulatory Visit: Payer: Self-pay | Admitting: Family Medicine

## 2016-10-06 DIAGNOSIS — M545 Low back pain: Principal | ICD-10-CM

## 2016-10-06 DIAGNOSIS — G8929 Other chronic pain: Secondary | ICD-10-CM

## 2016-10-23 ENCOUNTER — Ambulatory Visit
Admission: RE | Admit: 2016-10-23 | Discharge: 2016-10-23 | Disposition: A | Discharge status: Home / Self Care | Payer: BLUE CROSS/BLUE SHIELD | Source: Ambulatory Visit | Attending: Family Medicine | Admitting: Family Medicine

## 2016-10-23 DIAGNOSIS — M545 Low back pain: Principal | ICD-10-CM

## 2016-10-23 DIAGNOSIS — G8929 Other chronic pain: Secondary | ICD-10-CM

## 2016-10-23 MED ORDER — IOPAMIDOL (ISOVUE-M 200) INJECTION 41%
1.0000 mL | Freq: Once | INTRAMUSCULAR | Status: AC
  Administered 2016-10-23: 1 mL via EPIDURAL

## 2016-10-23 MED ORDER — METHYLPREDNISOLONE ACETATE 40 MG/ML INJ SUSP (RADIOLOG
120.0000 mg | Freq: Once | INTRAMUSCULAR | Status: AC
  Administered 2016-10-23: 120 mg via EPIDURAL

## 2016-11-10 ENCOUNTER — Telehealth: Payer: Self-pay | Admitting: *Deleted

## 2016-11-10 NOTE — Telephone Encounter (Signed)
Patient called and would like to be set up for another ESI.

## 2016-11-11 NOTE — Telephone Encounter (Signed)
Ok for right L5-S1 ESI.  If he's still struggling despite these would consider physical therapy or neurosurgery referral.

## 2016-11-11 NOTE — Telephone Encounter (Signed)
Spoke to patient and told him that I would send order for ESI.

## 2016-11-12 ENCOUNTER — Other Ambulatory Visit: Payer: Self-pay | Admitting: Family Medicine

## 2016-11-12 DIAGNOSIS — G8929 Other chronic pain: Secondary | ICD-10-CM

## 2016-11-12 DIAGNOSIS — M545 Low back pain: Principal | ICD-10-CM

## 2016-11-27 ENCOUNTER — Other Ambulatory Visit: Payer: BLUE CROSS/BLUE SHIELD

## 2016-12-01 ENCOUNTER — Ambulatory Visit
Admission: RE | Admit: 2016-12-01 | Discharge: 2016-12-01 | Disposition: A | Discharge status: Home / Self Care | Payer: BLUE CROSS/BLUE SHIELD | Source: Ambulatory Visit | Attending: Family Medicine | Admitting: Family Medicine

## 2016-12-01 DIAGNOSIS — M545 Low back pain: Principal | ICD-10-CM

## 2016-12-01 DIAGNOSIS — G8929 Other chronic pain: Secondary | ICD-10-CM

## 2016-12-01 MED ORDER — IOPAMIDOL (ISOVUE-M 200) INJECTION 41%
1.0000 mL | Freq: Once | INTRAMUSCULAR | Status: AC
  Administered 2016-12-01: 1 mL via EPIDURAL

## 2016-12-01 MED ORDER — METHYLPREDNISOLONE ACETATE 40 MG/ML INJ SUSP (RADIOLOG
120.0000 mg | Freq: Once | INTRAMUSCULAR | Status: AC
  Administered 2016-12-01: 120 mg via EPIDURAL

## 2016-12-01 NOTE — Discharge Instructions (Signed)

## 2016-12-02 ENCOUNTER — Other Ambulatory Visit: Payer: BLUE CROSS/BLUE SHIELD

## 2017-03-18 ENCOUNTER — Ambulatory Visit (INDEPENDENT_AMBULATORY_CARE_PROVIDER_SITE_OTHER): Payer: BLUE CROSS/BLUE SHIELD | Admitting: Family Medicine

## 2017-03-18 ENCOUNTER — Telehealth: Payer: Self-pay | Admitting: *Deleted

## 2017-03-18 ENCOUNTER — Encounter: Payer: Self-pay | Admitting: Family Medicine

## 2017-03-18 DIAGNOSIS — M5441 Lumbago with sciatica, right side: Secondary | ICD-10-CM

## 2017-03-18 MED ORDER — DICLOFENAC SODIUM 75 MG PO TBEC: 75.0000 mg | DELAYED_RELEASE_TABLET | Freq: Two times a day (BID) | ORAL | 0 refills | Status: DC | PRN

## 2017-03-18 MED ORDER — TIZANIDINE HCL 4 MG PO TABS: 4.0000 mg | ORAL_TABLET | Freq: Four times a day (QID) | ORAL | 1 refills | Status: DC | PRN

## 2017-03-18 NOTE — Patient Instructions (Addendum)
You have lumbar radiculopathy (a pinched nerve in your low back). We will go ahead with a cortisone shot as discussed. Take diclofenac twice a day with food for pain and inflammation. Tizanidine as needed for muscle spasms (no driving on this medicine if it makes you sleepy). Stay as active as possible. Call us a week after the injection to let me know how you're doing.

## 2017-03-18 NOTE — Telephone Encounter (Signed)
Called patient and scheduled him an appointment for this afternoon

## 2017-03-18 NOTE — Telephone Encounter (Signed)
We haven't seen him in over 6 months - I'd recommend reevaluating him in the office.

## 2017-03-18 NOTE — Telephone Encounter (Signed)
Patient called and wants to know if he can get another ESI. Has gotten relief from the other injections. Stated that the pain was starting to come back last week.

## 2017-03-19 ENCOUNTER — Other Ambulatory Visit: Payer: Self-pay | Admitting: Family Medicine

## 2017-03-19 DIAGNOSIS — M5416 Radiculopathy, lumbar region: Secondary | ICD-10-CM

## 2017-03-19 NOTE — Progress Notes (Signed)
PCP: System, Pcp Not In  Subjective:   HPI: Patient is a 51 y.o. male here for low back pain.  1/4: Patient reports he's had about 1 month of severe right sided low back, hip pain. Pain starts back on right side, worse with rotation. Can radiate down to right foot. Difficulty walking at times. Given toradol in ED which helped transiently. Pain is 10/10, sharp. Worse with squatting, bending, going from sitting to standing. Tried hydrocodone, ibuprofen. Occasional numbness down to knee. No bowel/bladder dysfunction. No skin changes.  1/15: Patient reports no improvement with prednisone, robaxin, oxycodone. He went to ED - tried meloxicam which did help some. Pain radiates from right side of hip down into all toes. Associated numbness through this distribution too. Pain is 10/10, worse by end of day, sharp. Worse with sitting then standing. No bowel/bladder dysfunction. No skin changes.  7/26: Patient returns today for worsened right side low back pain. Patient states radiates into right hip. Pain level 8/10 and sharp. Felt this flare up over past week. Tried robaxin and icing. Pain worse going from sitting to standing. No bowel/bladder dysfunction. No numbness or skin changes.  Past Medical History:  Diagnosis Date  . Chronic right-sided low back pain with right-sided sciatica   . Diabetes mellitus without complication (HCC)   . Herpes   . HIV (human immunodeficiency virus infection) (HCC)   . Hypertension     Current Outpatient Prescriptions on File Prior to Visit  Medication Sig Dispense Refill  . emtricitabine-rilpivir-tenofovir AF (ODEFSEY) 200-25-25 MG TABS tablet TAKE 1 TABLET BY MOUTH DAILY    . hydrochlorothiazide (HYDRODIURIL) 25 MG tablet Take 25 mg by mouth daily.    Marland Kitchen. lisinopril (PRINIVIL,ZESTRIL) 10 MG tablet Take 10 mg by mouth.    . metFORMIN (GLUCOPHAGE) 500 MG tablet Take 500 mg by mouth.    Marland Kitchen. omeprazole (PRILOSEC) 20 MG capsule Take 1 capsule (20  mg total) by mouth daily. 20 capsule 0  . valACYclovir (VALTREX) 1000 MG tablet TAKE 1 TABLET BY MOUTH DAILY     No current facility-administered medications on file prior to visit.     No past surgical history on file.  No Known Allergies  Social History   Social History  . Marital status: Single    Spouse name: N/A  . Number of children: N/A  . Years of education: N/A   Occupational History  . Not on file.   Social History Main Topics  . Smoking status: Current Every Day Smoker    Types: Cigars  . Smokeless tobacco: Never Used  . Alcohol use Yes     Comment: 1 q wk  . Drug use: Yes    Types: Marijuana  . Sexual activity: Not on file   Other Topics Concern  . Not on file   Social History Narrative  . No narrative on file    Family History  Problem Relation Age of Onset  . Hypertension Mother   . Hypertension Father   . Hypertension Sister   . Hypertension Brother     BP (!) 134/95   Pulse 78   Ht 6\' 2"  (1.88 m)   Wt 215 lb (97.5 kg)   BMI 27.60 kg/m   Review of Systems: See HPI above.     Objective:  Physical Exam:  Gen: NAD, comfortable in exam room  Back/right hip: No gross deformity, scoliosis. No paraspinal TTP.  No midline or bony TTP.  No hip tenderness including trochanter. FROM. Strength LEs  5/5 all muscle groups.   2+ MSRs in patellar and achilles tendons, equal bilaterally. Negative SLRs. Sensation intact to light touch bilaterally. Negative logroll bilateral hips   Assessment & Plan:  1. Lumbar radiculopathy - history consistent with radiculopathy - normal exam today.  S/p prednisone, robaxin, oxycodone.  MRI showed L4-5 extrusion with right L5 impingement.  He has had response to ESIs and prefers to go ahead with another one of these.  Diclofena and tizanidine if needed.  Advised will not rx narcotics.  Consider neurosurgery referral if he doesn't improve.

## 2017-03-19 NOTE — Assessment & Plan Note (Signed)
history consistent with radiculopathy - normal exam today.  S/p prednisone, robaxin, oxycodone.  MRI showed L4-5 extrusion with right L5 impingement.  He has had response to ESIs and prefers to go ahead with another one of these.  Diclofena and tizanidine if needed.  Advised will not rx narcotics.  Consider neurosurgery referral if he doesn't improve.

## 2017-03-27 ENCOUNTER — Encounter (HOSPITAL_BASED_OUTPATIENT_CLINIC_OR_DEPARTMENT_OTHER): Payer: Self-pay | Admitting: *Deleted

## 2017-03-27 ENCOUNTER — Emergency Department (HOSPITAL_BASED_OUTPATIENT_CLINIC_OR_DEPARTMENT_OTHER)
Admission: EM | Admit: 2017-03-27 | Discharge: 2017-03-27 | Disposition: A | Discharge status: Home / Self Care | Payer: BLUE CROSS/BLUE SHIELD | Attending: Emergency Medicine | Admitting: Emergency Medicine

## 2017-03-27 DIAGNOSIS — I10 Essential (primary) hypertension: Secondary | ICD-10-CM | POA: Insufficient documentation

## 2017-03-27 DIAGNOSIS — Z79899 Other long term (current) drug therapy: Secondary | ICD-10-CM | POA: Diagnosis not present

## 2017-03-27 DIAGNOSIS — K1120 Sialoadenitis, unspecified: Secondary | ICD-10-CM

## 2017-03-27 DIAGNOSIS — R591 Generalized enlarged lymph nodes: Secondary | ICD-10-CM | POA: Diagnosis not present

## 2017-03-27 DIAGNOSIS — Z87891 Personal history of nicotine dependence: Secondary | ICD-10-CM | POA: Insufficient documentation

## 2017-03-27 DIAGNOSIS — E119 Type 2 diabetes mellitus without complications: Secondary | ICD-10-CM | POA: Diagnosis not present

## 2017-03-27 DIAGNOSIS — Z7984 Long term (current) use of oral hypoglycemic drugs: Secondary | ICD-10-CM | POA: Insufficient documentation

## 2017-03-27 MED ORDER — NAPROXEN 500 MG PO TABS: 500.0000 mg | ORAL_TABLET | Freq: Two times a day (BID) | ORAL | 0 refills | Status: DC

## 2017-03-27 MED ORDER — CEPHALEXIN 500 MG PO CAPS: 500.0000 mg | ORAL_CAPSULE | Freq: Four times a day (QID) | ORAL | 0 refills | Status: DC

## 2017-03-27 NOTE — ED Provider Notes (Signed)
[facility]   CSN: [id number] Arrival date & time: 03/27/17  1032     History   Chief Complaint Chief Complaint  Patient presents with  . Lymphadenopathy    HPI Maurice Turner is a 51 y.o. male.  HPI Patient presents to the emergency room for evaluation of swollen gland in his neck. Patient states he noticed it about a week ago. He has a sore lump below the mandible on the left side. Patient denies any trouble with sore throat or toothache. No earache.  No fevers or chills.  Patient states it is not increasing in size but does not seem to be going away. Past Medical History:  Diagnosis Date  . Chronic right-sided low back pain with right-sided sciatica   . Diabetes mellitus without complication (HCC)   . Herpes   . HIV (human immunodeficiency virus infection) (HCC)   . Hypertension     Patient Active Problem List   Diagnosis Date Noted  . HIV disease (HCC) 08/31/2016  . Type 2 diabetes mellitus without complication (HCC) 12/11/2015  . Essential hypertension with goal blood pressure less than 130/80 01/19/2014  . Low back pain 11/22/2011  . Herpes simplex type 2 infection 06/01/2011  . 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 No pertinent surgical history.     Home Medications    Prior to Admission medications   Medication Sig Start Date End Date Taking? Authorizing Provider  cephALEXin (KEFLEX) 500 MG capsule Take 1 capsule (500 mg total) by mouth 4 (four) times daily. 03/27/17   Linwood DibblesKnapp, Nera Haworth, MD  diclofenac (VOLTAREN) 75 MG EC tablet Take 1 tablet (75 mg total) by mouth 2 (two) times daily as needed. 03/18/17   Lenda KelpHudnall, Shane R, MD  emtricitabine-rilpivir-tenofovir AF (ODEFSEY) 200-25-25 MG TABS tablet TAKE 1 TABLET BY MOUTH DAILY 05/25/16   [provider]  hydrochlorothiazide (HYDRODIURIL) 25 MG tablet Take 25 mg by mouth daily.    [provider]  lisinopril (PRINIVIL,ZESTRIL) 10 MG tablet Take 10  mg by mouth. 12/12/15 12/11/16  [provider]  metFORMIN (GLUCOPHAGE) 500 MG tablet Take 500 mg by mouth. 06/02/16   [provider]  omeprazole (PRILOSEC) 20 MG capsule Take 1 capsule (20 mg total) by mouth daily. 09/19/16   Derwood KaplanNanavati, Ankit, MD  tiZANidine (ZANAFLEX) 4 MG tablet Take 1 tablet (4 mg total) by mouth every 6 (six) hours as needed for muscle spasms. 03/18/17   Hudnall, Azucena FallenShane R, MD  valACYclovir (VALTREX) 1000 MG tablet TAKE 1 TABLET BY MOUTH DAILY 08/19/16   [provider]    Family History Family History  Problem Relation Age of Onset  . Hypertension Mother   . Hypertension Father   . Hypertension Sister   . Hypertension Brother     Social History Social History  Substance Use Topics  . Smoking status: Former Smoker    Types: Cigars  . Smokeless tobacco: Never Used  . Alcohol use Yes     Comment: soc     Allergies   Patient has no known allergies.   Review of Systems Review of Systems  All other systems reviewed and are negative.    Physical Exam Updated Vital Signs BP (!) 168/115 (BP Location: Left Arm)   Pulse 73   Temp 97.9 F (36.6 C)   Resp 18   Ht 1.88 m (6\' 2" )   Wt 102.1 kg (225 lb)   SpO2 99%   BMI 28.89 kg/m  Physical Exam  Constitutional: He appears well-developed and well-nourished. No distress.  HENT:  Head: Normocephalic and atraumatic.  Right Ear: External ear normal.  Left Ear: External ear normal.  Mouth/Throat: Oropharynx is clear and moist. No oropharyngeal exudate.  Normal tympanic membranes bilaterally  Eyes: Conjunctivae are normal. Right eye exhibits no discharge. Left eye exhibits no discharge. No scleral icterus.  Neck: Neck supple. No tracheal deviation present.  Cardiovascular: Normal rate.   Pulmonary/Chest: Effort normal. No stridor. No respiratory distress.  Abdominal: He exhibits no distension.  Musculoskeletal: He exhibits no edema.  Lymphadenopathy:       Head (right side): No  submandibular adenopathy present.       Head (left side): Submandibular adenopathy present.    He has no cervical adenopathy.  Isolated area of either lymphadenopathy or submandibular gland swelling, approximately 1-2 cm in size, no fluctuance, no surrounding erythema  Neurological: He is alert. Cranial nerve deficit: no gross deficits.  Skin: Skin is warm and dry. No rash noted.  Psychiatric: He has a normal mood and affect.  Nursing note and vitals reviewed.    ED Treatments / Results    Radiology No results found.  Procedures Procedures (including critical care time)  Medications Ordered in ED Medications - No data to display   Initial Impression / Assessment and Plan / ED Course  I have reviewed the triage vital signs and the nursing notes.  Pertinent labs & imaging results that were available during my care of the patient were reviewed by me and considered in my medical decision making (see chart for details).   I suspect this is most likely an inflamed submandibular gland rather than lymphadenopathy. I will start the patient on small antibiotics and have him take nonsteroidal anti-inflammatory medications.  We discussed increasing salivation by sucking on sour candies.  Discussed the importance of following up with his primary doctor to make sure the symptoms/swelling resolve  Final Clinical Impressions(s) / ED Diagnoses   Final diagnoses:  Submandibular gland inflammation    New Prescriptions New Prescriptions   CEPHALEXIN (KEFLEX) 500 MG CAPSULE    Take 1 capsule (500 mg total) by mouth 4 (four) times daily.     Linwood DibblesKnapp, Stormie Ventola, MD 03/27/17 1102

## 2017-03-27 NOTE — ED Triage Notes (Signed)
Pt c/o abscess to face x 1 week

## 2017-03-27 NOTE — Discharge Instructions (Signed)
Follow-up with a primary care doctor in 1-2 weeks to make sure the swelling has resolved. Monitor for fever or worsening symptoms. Take medications as prescribed.  Continue your voltaren for pain. Promote salivation by sucking on sour foods

## 2017-03-30 ENCOUNTER — Ambulatory Visit
Admission: RE | Admit: 2017-03-30 | Discharge: 2017-03-30 | Disposition: A | Discharge status: Home / Self Care | Payer: BLUE CROSS/BLUE SHIELD | Source: Ambulatory Visit | Attending: Family Medicine | Admitting: Family Medicine

## 2017-03-30 DIAGNOSIS — M5416 Radiculopathy, lumbar region: Secondary | ICD-10-CM

## 2017-03-30 MED ORDER — METHYLPREDNISOLONE ACETATE 40 MG/ML INJ SUSP (RADIOLOG
120.0000 mg | Freq: Once | INTRAMUSCULAR | Status: AC
  Administered 2017-03-30: 120 mg via EPIDURAL

## 2017-03-30 MED ORDER — IOPAMIDOL (ISOVUE-M 200) INJECTION 41%
1.0000 mL | Freq: Once | INTRAMUSCULAR | Status: AC
  Administered 2017-03-30: 1 mL via EPIDURAL

## 2017-03-30 NOTE — Discharge Instructions (Signed)

## 2017-04-21 ENCOUNTER — Telehealth: Payer: Self-pay | Admitting: Family Medicine

## 2017-04-21 NOTE — Telephone Encounter (Signed)
Patient called stating he is still having pain in his lower back. States the injection he had did not bring any relief. Patient requested a note for work to either be out of work or to be on light duty restrictions.   Patient was informed he would likely need an OV, but will check with provider first

## 2017-04-21 NOTE — Telephone Encounter (Signed)
If he's not improving we need to go ahead with neurosurgery referral for evaluation.  We could consider light duty until he gets to an appointment with them.

## 2017-04-21 NOTE — Telephone Encounter (Signed)
Spoke to patient and would like to do a referral for neurosurgery. Patient would also like a light duty restriction note. Will pick up on 04-22-17.

## 2017-04-21 NOTE — Telephone Encounter (Signed)
Go ahead and make neurosurgery referral/appointment first - will need the date from that to see how long to write note for.

## 2017-04-22 ENCOUNTER — Telehealth: Payer: Self-pay | Admitting: *Deleted

## 2017-04-22 NOTE — Telephone Encounter (Signed)
Letter written through appointment.

## 2017-04-22 NOTE — Telephone Encounter (Signed)
Appointment has been scheduled with neurosurgery for 05-04-17.

## 2017-04-30 ENCOUNTER — Telehealth: Payer: Self-pay | Admitting: Family Medicine

## 2017-04-30 NOTE — Telephone Encounter (Signed)
Patient requesting a note for work stating that he can return full duty with no restrictions.  States his work will not allow him to be there on light duty and he can not afford to not be working

## 2017-04-30 NOTE — Telephone Encounter (Signed)
Note written - he needs to keep neurosurgery appointment on 9/11 though.

## 2017-04-30 NOTE — Telephone Encounter (Signed)
Note written and patient to pick up.

## 2017-05-24 IMAGING — XA Imaging study
1 series · 1 of 1 positions shown · non-contrast
Comparison: none

CLINICAL DATA: Lumbosacral spondylosis without myelopathy. RIGHT
leg radicular symptoms. Patient significantly improved after the
previous injection.

[Series 1: ortho standard · 1 of 1 slices shown]
[im 1/1]
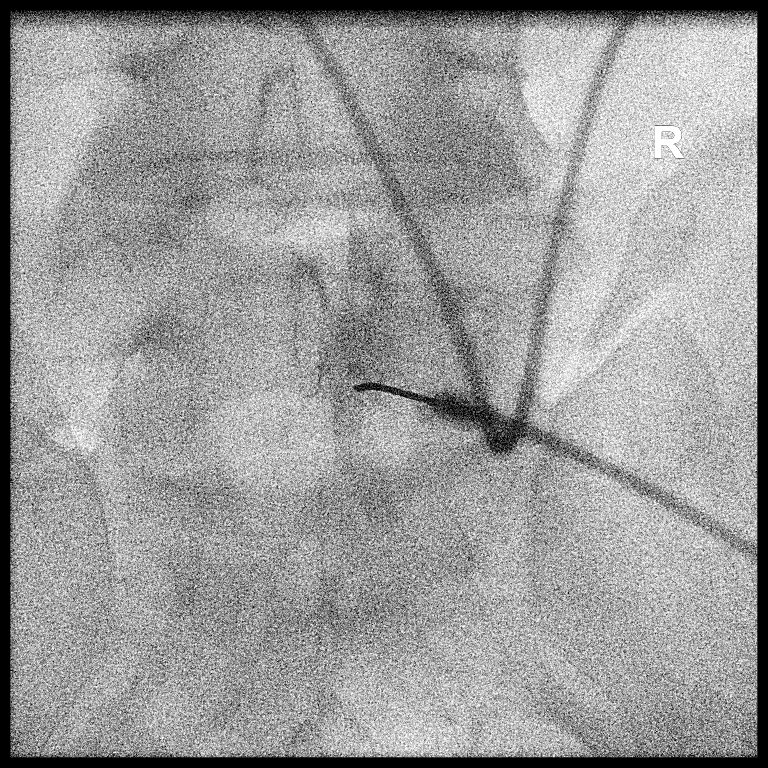

[1 of 1 positions shown; findings below may reference images not displayed]

FLUOROSCOPY TIME:  13 seconds corresponding to a Dose Area Product
of 11.35 ?Gy*m2

PROCEDURE:
The procedure, risks, benefits, and alternatives were explained to
the patient. Questions regarding the procedure were encouraged and
answered. The patient understands and consents to the procedure.

LUMBAR EPIDURAL INJECTION:

An interlaminar approach was performed on RIGHT at L5-S1. The
overlying skin was cleansed and anesthetized. A 20 gauge epidural
needle was advanced using loss-of-resistance technique.

DIAGNOSTIC EPIDURAL INJECTION:

Injection of Isovue-M 200 shows a good epidural pattern with spread
above and below the level of needle placement, primarily on the
RIGHT; no vascular opacification is seen.

THERAPEUTIC EPIDURAL INJECTION:

120.0 Mg of Depo-Medrol mixed with 2 mL 1% lidocaine were instilled.
The procedure was well-tolerated, and the patient was discharged
thirty minutes following the injection in good condition.

COMPLICATIONS:
None.
IMPRESSION: Technically successful epidural injection on the RIGHT L5-S1 # 2.

## 2017-08-18 ENCOUNTER — Encounter (HOSPITAL_BASED_OUTPATIENT_CLINIC_OR_DEPARTMENT_OTHER): Payer: Self-pay

## 2017-08-18 ENCOUNTER — Emergency Department (HOSPITAL_BASED_OUTPATIENT_CLINIC_OR_DEPARTMENT_OTHER)
Admission: EM | Admit: 2017-08-18 | Discharge: 2017-08-18 | Disposition: A | Discharge status: Home / Self Care | Payer: BLUE CROSS/BLUE SHIELD | Attending: Emergency Medicine | Admitting: Emergency Medicine

## 2017-08-18 ENCOUNTER — Other Ambulatory Visit: Payer: Self-pay

## 2017-08-18 DIAGNOSIS — E119 Type 2 diabetes mellitus without complications: Secondary | ICD-10-CM | POA: Diagnosis not present

## 2017-08-18 DIAGNOSIS — I10 Essential (primary) hypertension: Secondary | ICD-10-CM | POA: Diagnosis not present

## 2017-08-18 DIAGNOSIS — M545 Low back pain: Secondary | ICD-10-CM | POA: Diagnosis present

## 2017-08-18 DIAGNOSIS — Z7984 Long term (current) use of oral hypoglycemic drugs: Secondary | ICD-10-CM | POA: Insufficient documentation

## 2017-08-18 DIAGNOSIS — Z79899 Other long term (current) drug therapy: Secondary | ICD-10-CM | POA: Insufficient documentation

## 2017-08-18 DIAGNOSIS — M5431 Sciatica, right side: Secondary | ICD-10-CM | POA: Diagnosis not present

## 2017-08-18 DIAGNOSIS — Z87891 Personal history of nicotine dependence: Secondary | ICD-10-CM | POA: Diagnosis not present

## 2017-08-18 DIAGNOSIS — B20 Human immunodeficiency virus [HIV] disease: Secondary | ICD-10-CM | POA: Insufficient documentation

## 2017-08-18 DIAGNOSIS — T148XXA Other injury of unspecified body region, initial encounter: Secondary | ICD-10-CM

## 2017-08-18 MED ORDER — DEXAMETHASONE SODIUM PHOSPHATE 10 MG/ML IJ SOLN
10.0000 mg | Freq: Once | INTRAMUSCULAR | Status: AC
  Administered 2017-08-18: 10 mg via INTRAMUSCULAR
  Filled 2017-08-18: qty 1

## 2017-08-18 MED ORDER — PREDNISONE 20 MG PO TABS: ORAL_TABLET | ORAL | 0 refills | Status: DC

## 2017-08-18 MED ORDER — KETOROLAC TROMETHAMINE 60 MG/2ML IM SOLN
60.0000 mg | Freq: Once | INTRAMUSCULAR | Status: AC
Start: 2017-08-18 — End: 2017-08-18
  Administered 2017-08-18: 60 mg via INTRAMUSCULAR
  Filled 2017-08-18: qty 2

## 2017-08-18 MED ORDER — ORPHENADRINE CITRATE ER 100 MG PO TB12: 100.0000 mg | ORAL_TABLET | Freq: Two times a day (BID) | ORAL | 0 refills | Status: DC

## 2017-08-18 NOTE — ED Provider Notes (Signed)
[facility] EMERGENCY DEPARTMENT Provider Note   CSN: [id number] Arrival date & time: 08/18/17  1827     History   Chief Complaint Chief Complaint  Patient presents with  . Back Pain    HPI Melissa MontaneKevin Bolte is a 51 y.o. male.  HPI Patient reports he chronically has problems with his back.  He reports that for the past several days now he has had a flareup of his right lower back pain.  He reports that radiates down into his buttock and his thigh.  Trying Robaxin but not getting any relief.  He reports during the summer he had gotten shot that really helped. Past Medical History:  Diagnosis Date  . Chronic right-sided low back pain with right-sided sciatica   . Diabetes mellitus without complication (HCC)   . Herpes   . HIV (human immunodeficiency virus infection) (HCC)   . Hypertension     Patient Active Problem List   Diagnosis Date Noted  . HIV disease (HCC) 08/31/2016  . Type 2 diabetes mellitus without complication (HCC) 12/11/2015  . Essential hypertension with goal blood pressure less than 130/80 01/19/2014  . Low back pain 11/22/2011  . Herpes simplex type 2 infection 06/01/2011  . 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 No pertinent surgical history.     Home Medications    Prior to Admission medications   Medication Sig Start Date End Date Taking? Authorizing Provider  cephALEXin (KEFLEX) 500 MG capsule Take 1 capsule (500 mg total) by mouth 4 (four) times daily. 03/27/17   Linwood DibblesKnapp, Jon, MD  diclofenac (VOLTAREN) 75 MG EC tablet Take 1 tablet (75 mg total) by mouth 2 (two) times daily as needed. 03/18/17   Lenda KelpHudnall, Shane R, MD  emtricitabine-rilpivir-tenofovir AF (ODEFSEY) 200-25-25 MG TABS tablet TAKE 1 TABLET BY MOUTH DAILY 05/25/16   [provider]  hydrochlorothiazide (HYDRODIURIL) 25 MG tablet Take 25 mg by mouth daily.    [provider]  lisinopril (PRINIVIL,ZESTRIL) 10 MG tablet Take 10 mg by mouth.  12/12/15 12/11/16  [provider]  metFORMIN (GLUCOPHAGE) 500 MG tablet Take 500 mg by mouth. 06/02/16   [provider]  omeprazole (PRILOSEC) 20 MG capsule Take 1 capsule (20 mg total) by mouth daily. 09/19/16   Derwood KaplanNanavati, Ankit, MD  orphenadrine (NORFLEX) 100 MG tablet Take 1 tablet (100 mg total) by mouth 2 (two) times daily. 08/18/17   Arby BarrettePfeiffer, Daesean Lazarz, MD  predniSONE (DELTASONE) 20 MG tablet 3 tabs po daily x 3 days, then 2 tabs x 3 days, then 1.5 tabs x 3 days, then 1 tab x 3 days, then 0.5 tabs x 3 days 08/20/17   Arby BarrettePfeiffer, Abdulrahim Siddiqi, MD  tiZANidine (ZANAFLEX) 4 MG tablet Take 1 tablet (4 mg total) by mouth every 6 (six) hours as needed for muscle spasms. 03/18/17   Hudnall, Azucena FallenShane R, MD  valACYclovir (VALTREX) 1000 MG tablet TAKE 1 TABLET BY MOUTH DAILY 08/19/16   [provider]    Family History Family History  Problem Relation Age of Onset  . Hypertension Mother   . Hypertension Father   . Hypertension Sister   . Hypertension Brother     Social History Social History   Tobacco Use  . Smoking status: Former Smoker    Types: Cigars  . Smokeless tobacco: Never Used  Substance Use Topics  . Alcohol use: Yes    Comment: occ  . Drug use: No     Allergies   Patient has  no known allergies.   Review of Systems Review of Systems 10 Systems reviewed and are negative for acute change except as noted in the HPI.   Physical Exam Updated Vital Signs BP (!) 189/104 (BP Location: Left Arm)   Pulse 64   Temp 98.6 F (37 C) (Oral)   Resp 18   Ht 6' (1.829 m)   Wt 109.8 kg (242 lb)   SpO2 99%   BMI 32.82 kg/m   Physical Exam  Constitutional: He is oriented to person, place, and time. He appears well-developed and well-nourished.  Patient is up and ambulatory about the emergency department and clinically well in appearance.  He is alert and nontoxic.  Walking in and out of his room without gait dysfunction.  HENT:  Head: Normocephalic and atraumatic.    Eyes: EOM are normal.  Neck: Neck supple.  Cardiovascular: Normal rate and regular rhythm.  No murmur heard. Pulmonary/Chest: Effort normal and breath sounds normal. No respiratory distress.  Abdominal: Soft. He exhibits no distension. There is no tenderness. There is no guarding.  Musculoskeletal: Normal range of motion. He exhibits no edema.  Patient endorses tenderness to palpation in the right paraspinous muscle bodies and sciatic outlet in the buttock.  Patient does not have any peripheral edema.  He can get up and down off the stretcher without difficulty.  Neurological: He is alert and oriented to person, place, and time. No cranial nerve deficit. He exhibits normal muscle tone. Coordination normal.  Skin: Skin is warm and dry.  Psychiatric: He has a normal mood and affect.  Nursing note and vitals reviewed.    ED Treatments / Results  Labs (all labs ordered are listed, but only abnormal results are displayed) Labs Reviewed - No data to display  EKG  EKG Interpretation None       Radiology No results found.  Procedures Procedures (including critical care time)  Medications Ordered in ED Medications  dexamethasone (DECADRON) injection 10 mg (10 mg Intramuscular Given 08/18/17 2140)  ketorolac (TORADOL) injection 60 mg (60 mg Intramuscular Given 08/18/17 2140)     Initial Impression / Assessment and Plan / ED Course  I have reviewed the triage vital signs and the nursing notes.  Pertinent labs & imaging results that were available during my care of the patient were reviewed by me and considered in my medical decision making (see chart for details).     Final Clinical Impressions(s) / ED Diagnoses   Final diagnoses:  Sciatica of right side  Muscle strain  EMR review does indicate the patient has had chronic low back pain.  He describes classic sciatic symptoms.  No neurologic dysfunction, weakness or signs of infectious etiology.  At this time, will  administer Decadron IM and have the patient start a prednisone taper tomorrow.  Will change to Norflex as a muscle relaxer.  Patient is counseled to follow-up with his orthopedic doctor or neurosurgeon.  ED Discharge Orders        Ordered    predniSONE (DELTASONE) 20 MG tablet     08/18/17 2136    orphenadrine (NORFLEX) 100 MG tablet  2 times daily     08/18/17 2136       Arby BarrettePfeiffer, Miarose Lippert, MD 08/18/17 2145

## 2017-08-18 NOTE — ED Triage Notes (Signed)
C/o lower back pain x 1 week-reports hx of same-denies injury-NAD-steady gait

## 2017-08-18 NOTE — Discharge Instructions (Signed)
1.  You were given a shot of Decadron in the emergency department.  Start taper prednisone day after tomorrow. 2.  Use Norflex as prescribed as a muscle relaxer for added pain control. 2.  Make an appointment as soon as possible with your orthopedic doctor or your back pain specialist.

## 2017-08-18 NOTE — ED Notes (Signed)
Pt states he is going to lobby to talk to friend. Pt informed that MD has signed up to see him and he should stay in the room so he doesn't miss her. Pt chose to go to lobby regardless.

## 2017-08-18 NOTE — ED Notes (Signed)
ED Provider at bedside. 

## 2017-11-13 ENCOUNTER — Other Ambulatory Visit: Payer: Self-pay

## 2017-11-13 ENCOUNTER — Emergency Department (HOSPITAL_BASED_OUTPATIENT_CLINIC_OR_DEPARTMENT_OTHER)
Admission: EM | Admit: 2017-11-13 | Discharge: 2017-11-13 | Disposition: A | Discharge status: Home / Self Care | Payer: BLUE CROSS/BLUE SHIELD | Attending: Emergency Medicine | Admitting: Emergency Medicine

## 2017-11-13 ENCOUNTER — Encounter (HOSPITAL_BASED_OUTPATIENT_CLINIC_OR_DEPARTMENT_OTHER): Payer: Self-pay | Admitting: Emergency Medicine

## 2017-11-13 ENCOUNTER — Emergency Department (HOSPITAL_BASED_OUTPATIENT_CLINIC_OR_DEPARTMENT_OTHER): Payer: BLUE CROSS/BLUE SHIELD

## 2017-11-13 DIAGNOSIS — E119 Type 2 diabetes mellitus without complications: Secondary | ICD-10-CM | POA: Diagnosis not present

## 2017-11-13 DIAGNOSIS — J209 Acute bronchitis, unspecified: Secondary | ICD-10-CM | POA: Insufficient documentation

## 2017-11-13 DIAGNOSIS — I10 Essential (primary) hypertension: Secondary | ICD-10-CM | POA: Diagnosis not present

## 2017-11-13 DIAGNOSIS — Z79899 Other long term (current) drug therapy: Secondary | ICD-10-CM | POA: Insufficient documentation

## 2017-11-13 DIAGNOSIS — R05 Cough: Secondary | ICD-10-CM | POA: Diagnosis present

## 2017-11-13 DIAGNOSIS — Z21 Asymptomatic human immunodeficiency virus [HIV] infection status: Secondary | ICD-10-CM | POA: Diagnosis not present

## 2017-11-13 MED ORDER — BENZONATATE 100 MG PO CAPS: 200.0000 mg | ORAL_CAPSULE | Freq: Three times a day (TID) | ORAL | 0 refills | Status: DC | PRN

## 2017-11-13 NOTE — Discharge Instructions (Signed)
1.  Schedule recheck with your doctor within a week.

## 2017-11-13 NOTE — ED Triage Notes (Signed)
Pt c/o continues coughing and throat itchiness for almost a week, no fever or chills.

## 2017-11-13 NOTE — ED Provider Notes (Signed)
[facility] EMERGENCY DEPARTMENT Provider Note   CSN: [id number] Arrival date & time: 11/13/17  0604     History   Chief Complaint Chief Complaint  Patient presents with  . Cough    HPI Maurice Turner is a 52 y.o. male.  HPI Cough for 1 week.  No fever, chills or body aches.  No chest pain with cough.  No shortness of breath.  Occasionally productive but mostly dry.  No nausea vomiting diarrhea.  Patient reports that he has been around multiple sick contacts who have bronchitis.  No sore throat or earache. Past Medical History:  Diagnosis Date  . Chronic right-sided low back pain with right-sided sciatica   . Diabetes mellitus without complication (HCC)   . Herpes   . HIV (human immunodeficiency virus infection) (HCC)   . Hypertension     Patient Active Problem List   Diagnosis Date Noted  . HIV disease (HCC) 08/31/2016  . Type 2 diabetes mellitus without complication (HCC) 12/11/2015  . Essential hypertension with goal blood pressure less than 130/80 01/19/2014  . Low back pain 11/22/2011  . Herpes simplex type 2 infection 06/01/2011  . 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 No pertinent surgical history.      Home Medications    Prior to Admission medications   Medication Sig Start Date End Date Taking? Authorizing Provider  emtricitabine-rilpivir-tenofovir AF (ODEFSEY) 200-25-25 MG TABS tablet TAKE 1 TABLET BY MOUTH DAILY 05/25/16  Yes [provider]  hydrochlorothiazide (HYDRODIURIL) 25 MG tablet Take 25 mg by mouth daily.   Yes [provider]  metFORMIN (GLUCOPHAGE) 500 MG tablet Take 500 mg by mouth. 06/02/16  Yes [provider]  benzonatate (TESSALON) 100 MG capsule Take 2 capsules (200 mg total) by mouth 3 (three) times daily as needed for cough. Swallow whole, do not chew or suck 11/13/17   Arby BarrettePfeiffer, Dollie Mayse, MD  cephALEXin (KEFLEX) 500 MG capsule Take 1 capsule (500 mg total) by mouth 4  (four) times daily. 03/27/17   Linwood DibblesKnapp, Jon, MD  diclofenac (VOLTAREN) 75 MG EC tablet Take 1 tablet (75 mg total) by mouth 2 (two) times daily as needed. 03/18/17   Lenda KelpHudnall, Shane R, MD  lisinopril (PRINIVIL,ZESTRIL) 10 MG tablet Take 10 mg by mouth. 12/12/15 12/11/16  [provider]  omeprazole (PRILOSEC) 20 MG capsule Take 1 capsule (20 mg total) by mouth daily. 09/19/16   Derwood KaplanNanavati, Ankit, MD  orphenadrine (NORFLEX) 100 MG tablet Take 1 tablet (100 mg total) by mouth 2 (two) times daily. 08/18/17   Arby BarrettePfeiffer, Thaison Kolodziejski, MD  predniSONE (DELTASONE) 20 MG tablet 3 tabs po daily x 3 days, then 2 tabs x 3 days, then 1.5 tabs x 3 days, then 1 tab x 3 days, then 0.5 tabs x 3 days 08/20/17   Arby BarrettePfeiffer, Trig Mcbryar, MD  tiZANidine (ZANAFLEX) 4 MG tablet Take 1 tablet (4 mg total) by mouth every 6 (six) hours as needed for muscle spasms. 03/18/17   Hudnall, Azucena FallenShane R, MD  valACYclovir (VALTREX) 1000 MG tablet TAKE 1 TABLET BY MOUTH DAILY 08/19/16   [provider]    Family History Family History  Problem Relation Age of Onset  . Hypertension Mother   . Hypertension Father   . Hypertension Sister   . Hypertension Brother     Social History Social History   Tobacco Use  . Smoking status: Former Smoker    Types: Cigars  . Smokeless tobacco: Never Used  Substance Use  Topics  . Alcohol use: Yes    Comment: occ  . Drug use: Yes    Types: Marijuana     Allergies   Patient has no known allergies.   Review of Systems Review of Systems 10 Systems reviewed and are negative for acute change except as noted in the HPI.   Physical Exam Updated Vital Signs BP (!) 152/92 (BP Location: Right Arm)   Pulse 78   Temp 98 F (36.7 C) (Oral)   Resp 18   Ht 6\' 2"  (1.88 m)   Wt 108.9 kg (240 lb)   SpO2 98%   BMI 30.81 kg/m   Physical Exam  Constitutional: He is oriented to person, place, and time. He appears well-developed and well-nourished.  HENT:  Head: Normocephalic and atraumatic.    Nose: Nose normal.  Mouth/Throat: Oropharynx is clear and moist.  Eyes: Conjunctivae and EOM are normal.  Neck: Neck supple.  Cardiovascular: Normal rate and regular rhythm.  No murmur heard. Pulmonary/Chest: Effort normal and breath sounds normal. No respiratory distress.  Abdominal: Soft. There is no tenderness.  Musculoskeletal: He exhibits no edema.  Neurological: He is alert and oriented to person, place, and time. No cranial nerve deficit. Coordination normal.  Skin: Skin is warm and dry.  Psychiatric: He has a normal mood and affect.  Nursing note and vitals reviewed.    ED Treatments / Results  Labs (all labs ordered are listed, but only abnormal results are displayed) Labs Reviewed - No data to display  EKG None  Radiology Dg Chest 2 View  Result Date: 11/13/2017 CLINICAL DATA:  Cough. EXAM: CHEST - 2 VIEW COMPARISON:  None. FINDINGS: The cardiomediastinal contours are normal. The lungs are clear. Pulmonary vasculature is normal. No consolidation, pleural effusion, or pneumothorax. No acute osseous abnormalities are seen. IMPRESSION: No acute pulmonary process. Electronically Signed   By: Rubye Oaks M.D.   On: 11/13/2017 06:56    Procedures Procedures (including critical care time)  Medications Ordered in ED Medications - No data to display   Initial Impression / Assessment and Plan / ED Course  I have reviewed the triage vital signs and the nursing notes.  Pertinent labs & imaging results that were available during my care of the patient were reviewed by me and considered in my medical decision making (see chart for details).     Final Clinical Impressions(s) / ED Diagnoses   Final diagnoses:  Acute bronchitis, unspecified organism  Patient has clinically well appearance.  Vital signs are stable.  No hypoxia, no respiratory distress, lungs are clear, no fever, no chest pain and normal chest x-ray.  At this time suspect viral bronchitis.  Patient  describes several contacts with similar cough.  Will treat with Jerilynn Som and PCP follow-up in 1 week.  ED Discharge Orders        Ordered    benzonatate (TESSALON) 100 MG capsule  3 times daily PRN     11/13/17 0707       Arby Barrette, MD 11/13/17 [phone]

## 2017-11-16 ENCOUNTER — Encounter (HOSPITAL_BASED_OUTPATIENT_CLINIC_OR_DEPARTMENT_OTHER): Payer: Self-pay | Admitting: *Deleted

## 2017-11-16 ENCOUNTER — Emergency Department (HOSPITAL_BASED_OUTPATIENT_CLINIC_OR_DEPARTMENT_OTHER)
Admission: EM | Admit: 2017-11-16 | Discharge: 2017-11-16 | Disposition: A | Discharge status: Home / Self Care | Payer: BLUE CROSS/BLUE SHIELD | Attending: Emergency Medicine | Admitting: Emergency Medicine

## 2017-11-16 ENCOUNTER — Other Ambulatory Visit: Payer: Self-pay

## 2017-11-16 DIAGNOSIS — E119 Type 2 diabetes mellitus without complications: Secondary | ICD-10-CM | POA: Diagnosis not present

## 2017-11-16 DIAGNOSIS — I10 Essential (primary) hypertension: Secondary | ICD-10-CM | POA: Insufficient documentation

## 2017-11-16 DIAGNOSIS — B20 Human immunodeficiency virus [HIV] disease: Secondary | ICD-10-CM | POA: Insufficient documentation

## 2017-11-16 DIAGNOSIS — Z79899 Other long term (current) drug therapy: Secondary | ICD-10-CM | POA: Diagnosis not present

## 2017-11-16 DIAGNOSIS — Z7984 Long term (current) use of oral hypoglycemic drugs: Secondary | ICD-10-CM | POA: Insufficient documentation

## 2017-11-16 DIAGNOSIS — L03011 Cellulitis of right finger: Secondary | ICD-10-CM | POA: Insufficient documentation

## 2017-11-16 DIAGNOSIS — Z87891 Personal history of nicotine dependence: Secondary | ICD-10-CM | POA: Insufficient documentation

## 2017-11-16 DIAGNOSIS — R600 Localized edema: Secondary | ICD-10-CM | POA: Diagnosis present

## 2017-11-16 MED ORDER — BUPIVACAINE HCL 0.5 % IJ SOLN
50.0000 mL | Freq: Once | INTRAMUSCULAR | Status: AC
  Administered 2017-11-16: 10 mL

## 2017-11-16 MED ORDER — DOXYCYCLINE HYCLATE 100 MG PO CAPS: 100.0000 mg | ORAL_CAPSULE | Freq: Two times a day (BID) | ORAL | 0 refills | Status: DC

## 2017-11-16 MED ORDER — HYDROCODONE-ACETAMINOPHEN 5-325 MG PO TABS: 2.0000 | ORAL_TABLET | ORAL | 0 refills | Status: DC | PRN

## 2017-11-16 MED ORDER — BUPIVACAINE HCL 0.5 % IJ SOLN
INTRAMUSCULAR | Status: AC
  Administered 2017-11-16: 10 mL
  Filled 2017-11-16: qty 1

## 2017-11-16 NOTE — ED Notes (Signed)
ED Provider at bedside. 

## 2017-11-16 NOTE — ED Provider Notes (Signed)
[facility] EMERGENCY DEPARTMENT Provider Note   CSN: [id number] Arrival date & time: 11/16/17  2010     History   Chief Complaint No chief complaint on file.   HPI Maurice Turner is a 52 y.o. male.  Chief complaint is painful  swollen thumb  HPI 52 year old male.  History of HIV.  Compliant with medications.  States he was biting his cuticle week ago.  He has redness swelling and pain of his right thumb.  No drainage.  Past Medical History:  Diagnosis Date  . Chronic right-sided low back pain with right-sided sciatica   . Diabetes mellitus without complication (HCC)   . Herpes   . HIV (human immunodeficiency virus infection) (HCC)   . Hypertension     Patient Active Problem List   Diagnosis Date Noted  . HIV disease (HCC) 08/31/2016  . Type 2 diabetes mellitus without complication (HCC) 12/11/2015  . Essential hypertension with goal blood pressure less than 130/80 01/19/2014  . Low back pain 11/22/2011  . Herpes simplex type 2 infection 06/01/2011  . 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 No pertinent surgical history.      Home Medications    Prior to Admission medications   Medication Sig Start Date End Date Taking? Authorizing Provider  benzonatate (TESSALON) 100 MG capsule Take 2 capsules (200 mg total) by mouth 3 (three) times daily as needed for cough. Swallow whole, do not chew or suck 11/13/17   Arby BarrettePfeiffer, Marcy, MD  cephALEXin (KEFLEX) 500 MG capsule Take 1 capsule (500 mg total) by mouth 4 (four) times daily. 03/27/17   Linwood DibblesKnapp, Jon, MD  diclofenac (VOLTAREN) 75 MG EC tablet Take 1 tablet (75 mg total) by mouth 2 (two) times daily as needed. 03/18/17   Hudnall, Azucena FallenShane R, MD  doxycycline (VIBRAMYCIN) 100 MG capsule Take 1 capsule (100 mg total) by mouth 2 (two) times daily. 11/16/17   Rolland PorterJames, Meriem Lemieux, MD  emtricitabine-rilpivir-tenofovir AF (ODEFSEY) 200-25-25 MG TABS tablet TAKE 1 TABLET BY MOUTH DAILY 05/25/16   [provider]  hydrochlorothiazide (HYDRODIURIL) 25 MG tablet Take 25 mg by mouth daily.    [provider]  HYDROcodone-acetaminophen (NORCO/VICODIN) 5-325 MG tablet Take 2 tablets by mouth every 4 (four) hours as needed. 11/16/17   Rolland PorterJames, Chelsia Serres, MD  lisinopril (PRINIVIL,ZESTRIL) 10 MG tablet Take 10 mg by mouth. 12/12/15 12/11/16  [provider]  metFORMIN (GLUCOPHAGE) 500 MG tablet Take 500 mg by mouth. 06/02/16   [provider]  omeprazole (PRILOSEC) 20 MG capsule Take 1 capsule (20 mg total) by mouth daily. 09/19/16   Derwood KaplanNanavati, Ankit, MD  orphenadrine (NORFLEX) 100 MG tablet Take 1 tablet (100 mg total) by mouth 2 (two) times daily. 08/18/17   Arby BarrettePfeiffer, Marcy, MD  predniSONE (DELTASONE) 20 MG tablet 3 tabs po daily x 3 days, then 2 tabs x 3 days, then 1.5 tabs x 3 days, then 1 tab x 3 days, then 0.5 tabs x 3 days 08/20/17   Arby BarrettePfeiffer, Marcy, MD  tiZANidine (ZANAFLEX) 4 MG tablet Take 1 tablet (4 mg total) by mouth every 6 (six) hours as needed for muscle spasms. 03/18/17   Hudnall, Azucena FallenShane R, MD  valACYclovir (VALTREX) 1000 MG tablet TAKE 1 TABLET BY MOUTH DAILY 08/19/16   [provider]    Family History Family History  Problem Relation Age of Onset  . Hypertension Mother   . Hypertension Father   . Hypertension Sister   . Hypertension Brother  Social History Social History   Tobacco Use  . Smoking status: Former Smoker    Types: Cigars  . Smokeless tobacco: Never Used  Substance Use Topics  . Alcohol use: Yes    Comment: occ  . Drug use: Yes    Types: Marijuana     Allergies   Patient has no known allergies.   Review of Systems Review of Systems  Constitutional: Negative for appetite change, chills, diaphoresis, fatigue and fever.  HENT: Negative for mouth sores, sore throat and trouble swallowing.   Eyes: Negative for visual disturbance.  Respiratory: Negative for cough, chest tightness, shortness of breath and wheezing.     Cardiovascular: Negative for chest pain.  Gastrointestinal: Negative for abdominal distention, abdominal pain, diarrhea, nausea and vomiting.  Endocrine: Negative for polydipsia, polyphagia and polyuria.  Genitourinary: Negative for dysuria, frequency and hematuria.  Musculoskeletal: Negative for gait problem.       Redness pain and swelling right thumb at base of nail  Skin: Negative for color change, pallor and rash.  Neurological: Negative for dizziness, syncope, light-headedness and headaches.  Hematological: Does not bruise/bleed easily.  Psychiatric/Behavioral: Negative for behavioral problems and confusion.     Physical Exam Updated Vital Signs BP (!) 148/94 (BP Location: Left Arm)   Pulse 85   Temp 98.1 F (36.7 C) (Oral)   Resp 18   Ht 6\' 2"  (1.88 m)   Wt 102.1 kg (225 lb)   SpO2 100%   BMI 28.89 kg/m   Physical Exam  Musculoskeletal:  Her knee came of base of right thumb proximal radial aspect.  Some soft tissue swelling and fluctuance into the lateral distal pad,?  Felon     ED Treatments / Results  Labs (all labs ordered are listed, but only abnormal results are displayed) Labs Reviewed - No data to display  EKG None  Radiology No results found.  Procedures Procedures (including critical care time)  Medications Ordered in ED Medications  bupivacaine (MARCAINE) 0.5 % (with pres) injection 50 mL (has no administration in time range)     Initial Impression / Assessment and Plan / ED Course  I have reviewed the triage vital signs and the nursing notes.  Pertinent labs & imaging results that were available during my care of the patient were reviewed by me and considered in my medical decision making (see chart for details).    INCISION AND DRAINAGE Performed by: Claudean Kinds Consent: Verbal consent obtained. Risks and benefits: risks, benefits and alternatives were discussed Type: abscess  Body area: Right thumb  Anesthesia: local  infiltration  Incision was made with a scalpel.  Local anesthetic: 0.5% Marcaine Metacarpal block  Anesthetic total: 4 ml  Complexity: Simple.  Incision made to separate eponychial fold.  Purulence expressed.  1/2 cm stab incision made the lateral soft tissues of the thumb.  Additional purulence expressed.  Both cavities irrigated.  Gauze placed into both wounds. Blunt dissection to break up loculations  Drainage: purulent  Drainage amount: moderate.  Packing material: 1/4 in iodoform gauze  Patient tolerance: Patient tolerated the procedure well with no immediate complications.     Thumb was wrapped.  Discharged home.  Remove gauze after soaking in 48 hours.  Continue doxycycline and gauze dressings.  Final Clinical Impressions(s) / ED Diagnoses   Final diagnoses:  Paronychia of right thumb    ED Discharge Orders        Ordered    doxycycline (VIBRAMYCIN) 100 MG capsule  2 times  daily     11/16/17 2133    HYDROcodone-acetaminophen (NORCO/VICODIN) 5-325 MG tablet  Every 4 hours PRN     11/16/17 2133       Rolland Porter, MD 11/16/17 2138

## 2017-11-16 NOTE — ED Triage Notes (Signed)
Right thumb is red, hot, painful and swollen. He has been using ointment, alcohol, and pressing the site. No drainage.

## 2017-11-16 NOTE — Discharge Instructions (Addendum)
Soak, and remove both pieces of gauze in 48 hours. Hydrocodone for pain.  Use sparingly. Doxycycline antibiotic as prescribed.

## 2019-01-18 ENCOUNTER — Ambulatory Visit (INDEPENDENT_AMBULATORY_CARE_PROVIDER_SITE_OTHER): Payer: BLUE CROSS/BLUE SHIELD | Admitting: Family Medicine

## 2019-01-18 ENCOUNTER — Other Ambulatory Visit: Payer: Self-pay

## 2019-01-18 ENCOUNTER — Encounter: Payer: Self-pay | Admitting: Family Medicine

## 2019-01-18 ENCOUNTER — Ambulatory Visit (HOSPITAL_BASED_OUTPATIENT_CLINIC_OR_DEPARTMENT_OTHER)
Admission: RE | Admit: 2019-01-18 | Discharge: 2019-01-18 | Disposition: A | Discharge status: Home / Self Care | Payer: BLUE CROSS/BLUE SHIELD | Source: Ambulatory Visit | Attending: Family Medicine | Admitting: Family Medicine

## 2019-01-18 VITALS — HR 85 | Ht 74.0 in | Wt 235.0 lb

## 2019-01-18 DIAGNOSIS — M549 Dorsalgia, unspecified: Secondary | ICD-10-CM

## 2019-01-18 DIAGNOSIS — R0781 Pleurodynia: Secondary | ICD-10-CM

## 2019-01-18 MED ORDER — METHOCARBAMOL 500 MG PO TABS: 500.0000 mg | ORAL_TABLET | Freq: Three times a day (TID) | ORAL | 1 refills | Status: DC | PRN

## 2019-01-18 MED ORDER — DICLOFENAC SODIUM 75 MG PO TBEC: 75.0000 mg | DELAYED_RELEASE_TABLET | Freq: Two times a day (BID) | ORAL | 1 refills | Status: DC

## 2019-01-18 NOTE — Patient Instructions (Addendum)
You strained your latissimus and have a rib contusion. Ice the area 15 minutes at a time 3-4 times a day. Voltaren 75mg  twice a day with food for pain and inflammation- take for 7 days then as needed. Robaxin up to three times a day as needed for muscle spasms. Topical aspercreme, salon pas patches may be helpful if needed. Follow up with me in 2 weeks for reevaluation.

## 2019-01-23 ENCOUNTER — Encounter: Payer: Self-pay | Admitting: Family Medicine
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
## 2019-01-30 ENCOUNTER — Ambulatory Visit: Payer: BC Managed Care – PPO | Admitting: Family Medicine

## 2019-02-02 ENCOUNTER — Ambulatory Visit (INDEPENDENT_AMBULATORY_CARE_PROVIDER_SITE_OTHER): Payer: BC Managed Care – PPO | Admitting: Family Medicine

## 2019-02-02 ENCOUNTER — Encounter: Payer: Self-pay | Admitting: Family Medicine

## 2019-02-02 ENCOUNTER — Other Ambulatory Visit: Payer: Self-pay

## 2019-02-02 VITALS — BP 117/87 | Ht 74.0 in | Wt 235.0 lb

## 2019-02-02 DIAGNOSIS — R0781 Pleurodynia: Secondary | ICD-10-CM | POA: Diagnosis not present

## 2019-02-02 MED ORDER — PREDNISONE 5 MG PO TABS: ORAL_TABLET | ORAL | 0 refills | Status: DC

## 2019-02-02 MED ORDER — GABAPENTIN 100 MG PO CAPS: 100.0000 mg | ORAL_CAPSULE | Freq: Three times a day (TID) | ORAL | 1 refills | Status: DC
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
## 2019-02-02 NOTE — Patient Instructions (Signed)
Nice to meet you Please try the gabapentin. You can start with one pill at night. You can increase this to 2 or 3 times daily.  Please try salon pos on the area.  You can also asprecreame with lidocaine  Please send me a message in MyChart with any questions or updates.  Please see me back in 4 weeks.   --Dr. Raeford Razor

## 2019-02-05 DIAGNOSIS — R0789 Other chest pain: Secondary | ICD-10-CM | POA: Insufficient documentation

## 2019-02-05 DIAGNOSIS — R0781 Pleurodynia: Secondary | ICD-10-CM | POA: Insufficient documentation

## 2019-02-05 NOTE — Assessment & Plan Note (Signed)
Pain is ongoing. No crepitus or step offs. No weakness.  - prednisone  - gabapentin  - counseled on HEP and supportive  - If no improvement consider PT or further imaging.

## 2019-03-02 ENCOUNTER — Encounter: Payer: Self-pay | Admitting: Family Medicine

## 2019-03-02 ENCOUNTER — Other Ambulatory Visit: Payer: Self-pay

## 2019-03-02 ENCOUNTER — Ambulatory Visit (INDEPENDENT_AMBULATORY_CARE_PROVIDER_SITE_OTHER): Payer: BC Managed Care – PPO | Admitting: Family Medicine

## 2019-03-02 DIAGNOSIS — R0781 Pleurodynia: Secondary | ICD-10-CM

## 2019-03-02 DIAGNOSIS — M5441 Lumbago with sciatica, right side: Secondary | ICD-10-CM

## 2019-03-02 MED ORDER — HYDROCODONE-ACETAMINOPHEN 5-325 MG PO TABS: 1.0000 | ORAL_TABLET | Freq: Three times a day (TID) | ORAL | 0 refills | Status: DC | PRN

## 2019-03-02 NOTE — Assessment & Plan Note (Signed)
Acute exacerbation that has recently exacerbated. Mild sciatic type symptoms.  - norco. Counseled this was a one time script and further prescriptions would need to come from pain management. Database was reviewed  - counseled on HEp and supportive care - consider PT or imaging if no improvement.

## 2019-03-02 NOTE — Assessment & Plan Note (Signed)
Seems to have improved at this time.  - counseled on supportive care
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
## 2019-03-02 NOTE — Patient Instructions (Signed)
Good to see you Please take the norco for severe pain.  Please try heat on the lower back  Please try the exercises   Please send me a message in MyChart with any questions or updates.  Please see me back in 3 weeks.   --Dr. Raeford Razor

## 2019-03-20 ENCOUNTER — Ambulatory Visit: Payer: BC Managed Care – PPO | Admitting: Family Medicine

## 2019-05-29 ENCOUNTER — Encounter (HOSPITAL_BASED_OUTPATIENT_CLINIC_OR_DEPARTMENT_OTHER): Payer: Self-pay | Admitting: Emergency Medicine

## 2019-05-29 ENCOUNTER — Other Ambulatory Visit: Payer: Self-pay

## 2019-05-29 ENCOUNTER — Emergency Department (HOSPITAL_BASED_OUTPATIENT_CLINIC_OR_DEPARTMENT_OTHER)
Admission: EM | Admit: 2019-05-29 | Discharge: 2019-05-29 | Disposition: A | Discharge status: Home / Self Care | Payer: BC Managed Care – PPO | Attending: Emergency Medicine | Admitting: Emergency Medicine

## 2019-05-29 DIAGNOSIS — R221 Localized swelling, mass and lump, neck: Secondary | ICD-10-CM | POA: Diagnosis present

## 2019-05-29 DIAGNOSIS — I10 Essential (primary) hypertension: Secondary | ICD-10-CM | POA: Insufficient documentation

## 2019-05-29 DIAGNOSIS — Z7984 Long term (current) use of oral hypoglycemic drugs: Secondary | ICD-10-CM | POA: Insufficient documentation

## 2019-05-29 DIAGNOSIS — R591 Generalized enlarged lymph nodes: Secondary | ICD-10-CM

## 2019-05-29 DIAGNOSIS — E119 Type 2 diabetes mellitus without complications: Secondary | ICD-10-CM | POA: Diagnosis not present

## 2019-05-29 DIAGNOSIS — B20 Human immunodeficiency virus [HIV] disease: Secondary | ICD-10-CM | POA: Diagnosis not present

## 2019-05-29 DIAGNOSIS — R590 Localized enlarged lymph nodes: Secondary | ICD-10-CM | POA: Insufficient documentation

## 2019-05-29 DIAGNOSIS — Z79899 Other long term (current) drug therapy: Secondary | ICD-10-CM | POA: Diagnosis not present

## 2019-05-29 NOTE — ED Notes (Signed)
Pt denies pain.  Lump to left under chin noted.

## 2019-05-29 NOTE — ED Notes (Signed)
ED Provider at bedside. 

## 2019-05-29 NOTE — ED Provider Notes (Signed)
[facility] EMERGENCY DEPARTMENT Provider Note   CSN: [id number] Arrival date & time: 05/29/19  8921     History   Chief Complaint No chief complaint on file.   HPI Maurice Turner is a 53 y.o. male with PMHx HTN, Diabetes, HIV who presents to the ED today complaining of gradual onset, constant, gradually improving, swelling to lymph node to left neck x 1 week. Pt reports the lymphnode is painful to the touch and he can feel it when he swallows. He states he believes he was bitten by a spider a month ago on his RLE and is concerned it could be related to that. He has been taking 600 mg Ibuprofen BID with relief. Denies fever, chills, sore throat, ear pain, drooling, difficulty swallowing, voice change, chest pain, shortness of breath, sinus pressure, or any other associated symptoms.        Past Medical History:  Diagnosis Date  . Chronic right-sided low back pain with right-sided sciatica   . Diabetes mellitus without complication ([place])   . Herpes   . HIV (human immunodeficiency virus infection) ([place])   . Hypertension     Patient Active Problem List   Diagnosis Date Noted  . Rib pain 02/05/2019  . HIV disease ([place]) 08/31/2016  . Type 2 diabetes mellitus without complication ([place]) 19/41/7408  . Essential hypertension with goal blood pressure less than 130/80 01/19/2014  . Low back pain 11/22/2011  . Herpes simplex type 2 infection 06/01/2011  . 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 No pertinent surgical history.      Home Medications    Prior to Admission medications   Medication Sig Start Date End Date Taking? Authorizing Provider  lisinopril (PRINIVIL,ZESTRIL) 10 MG tablet Take 10 mg by mouth. 12/12/15 05/29/19 Yes [provider]  metFORMIN (GLUCOPHAGE) 500 MG tablet Take 500 mg by mouth. 06/02/16  Yes [provider]  valACYclovir (VALTREX) 1000 MG tablet TAKE 1 TABLET BY MOUTH DAILY 08/19/16  Yes [provider]    Family History Family History  Problem Relation Age of Onset  . Hypertension Mother   . Hypertension Father   . Hypertension Sister   . Hypertension Brother      Social History Social History   Tobacco Use  . Smoking status: Former Smoker    Types: Cigars  . Smokeless tobacco: Never Used  Substance Use Topics  . Alcohol use: Yes    Comment: occ  . Drug use: Yes    Types: Marijuana     Allergies   Patient has no known allergies.   Review of Systems Review of Systems  Constitutional: Negative for chills and fever.  HENT: Negative for congestion, drooling, ear discharge, ear pain, rhinorrhea, sinus pressure, sinus pain, sore throat, trouble swallowing and voice change.        + cervical lymphadenopathy   Eyes: Negative for visual disturbance.  Respiratory: Negative for cough and shortness of breath.   Cardiovascular: Negative for chest pain.     Physical Exam Updated Vital Signs BP (!) 144/96 (BP Location: Right Arm)   Pulse 63   Temp 97.8 F (36.6 C) (Oral)   Resp 18   Ht 6\' 2"  (1.88 m)   Wt 104.3 kg   SpO2 96%   BMI 29.53 kg/m   Physical Exam Vitals signs and nursing note reviewed.  Constitutional:      Appearance: He is not ill-appearing or diaphoretic.  HENT:  Head: Normocephalic and atraumatic.     Right Ear: Tympanic membrane normal.     Left Ear: Tympanic membrane normal.     Nose: Nose normal.     Mouth/Throat:     Mouth: Mucous membranes are moist.     Pharynx: No oropharyngeal exudate or posterior oropharyngeal erythema.     Comments: Dental caries throughout; no signs of dental abscess; no ludwig's angina Eyes:     General:        Right eye: No discharge.        Left eye: No discharge.     Extraocular Movements: Extraocular movements intact.     Conjunctiva/sclera: Conjunctivae normal.     Pupils: Pupils are equal, round, and reactive to light.  Neck:     Musculoskeletal: Normal range of motion.    Cardiovascular:     Rate and Rhythm: Normal rate and regular rhythm.     Pulses: Normal pulses.  Pulmonary:     Effort: Pulmonary effort is normal.     Breath sounds: Normal breath sounds. No wheezing, rhonchi or rales.  Lymphadenopathy:     Cervical: Cervical adenopathy present.  Skin:    General: Skin is warm and dry.     Coloration: Skin is not jaundiced.  Neurological:     Mental Status: He is alert.      ED Treatments / Results  Labs (all labs ordered are listed, but only abnormal results are displayed) Labs Reviewed - No data to display  EKG None  Radiology No results found.  Procedures Procedures (including critical care time)  Medications Ordered in ED Medications - No data to display   Initial Impression / Assessment and Plan / ED Course  I have reviewed the triage vital signs and the nursing notes.  Pertinent labs & imaging results that were available during my care of the patient were reviewed by me and considered in my medical decision making (see chart for details).    53 year old male who presents the ED complaining of a nodule to the left side of his neck that has been swollen for the past week although gradually improving.  He does endorse some pain to the area with palpation.  Currently denying any infectious type symptoms.  He is afebrile in the ED without tachycardia or tachypnea.  He does not appear toxic.  He does have history of HIV but states he has been compliant with his medication.   Per Care Everywhere:The most recent CD4+ count is:  T-Helper Lymphs  Date Value Ref Range Status  09/22/2017 1.02 0.31 - 3.11 X1000/uL Final  08/27/2014 1.09 0.31 - 3.11 X1000 Final  and viral load is:  HIV, ULTRAQUANT  Date Value Ref Range Status  08/27/2014 TARGET NOT DETECTED c/ml Final   On physical exam patient does have enlarged cervical lymph node but it is mobile and mildly tender.  Throat is well-appearing without erythema, edema, exudate.  Uvula is  midline.  There is no voice change or concerns for deep space infection today.  TMs are clear bilaterally.  Lungs clear to auscultation bilaterally as well.  Do not feel patient needs any testing at this time Including lab work or chest x-rays.  Reports himself that the lymph node has decreased in size since onset.  He may be experiencing some lymphadenopathy due to cold.  He is advised to continue taking ibuprofen as needed for pain.  He is advised to follow-up with his infectious disease specialist.  I have discussed strict  return precautions with patient.  He is in agreement with plan at this time and stable for discharge home.  This note was prepared using Dragon voice recognition software and may include unintentional dictation errors due to the inherent limitations of voice recognition software.           Final Clinical Impressions(s) / ED Diagnoses   Final diagnoses:  Lymphadenopathy    ED Discharge Orders    None       Tanda Rockers, PA-C 05/29/19 9833    Raeford Razor, MD 05/29/19 1157

## 2019-05-29 NOTE — ED Triage Notes (Signed)
Pt presents with swelling to glands under left chin . Pt states he had a spider bite about a month ago and is concerned it is related.

## 2019-05-29 NOTE — Discharge Instructions (Addendum)
Please follow up with your Infectious Disease provider regarding your swollen lymph node if it persists for greater than 2-3 weeks

## 2019-08-07 ENCOUNTER — Encounter: Payer: Self-pay | Admitting: Family Medicine

## 2019-08-07 ENCOUNTER — Other Ambulatory Visit: Payer: Self-pay

## 2019-08-07 ENCOUNTER — Ambulatory Visit: Payer: Self-pay

## 2019-08-07 ENCOUNTER — Ambulatory Visit (INDEPENDENT_AMBULATORY_CARE_PROVIDER_SITE_OTHER): Payer: BC Managed Care – PPO | Admitting: Family Medicine

## 2019-08-07 VITALS — BP 113/75 | HR 74 | Ht 74.0 in | Wt 235.0 lb

## 2019-08-07 DIAGNOSIS — S43431D Superior glenoid labrum lesion of right shoulder, subsequent encounter: Secondary | ICD-10-CM | POA: Insufficient documentation

## 2019-08-07 DIAGNOSIS — M778 Other enthesopathies, not elsewhere classified: Secondary | ICD-10-CM

## 2019-08-07 DIAGNOSIS — M25511 Pain in right shoulder: Secondary | ICD-10-CM

## 2019-08-07 MED ORDER — TRIAMCINOLONE ACETONIDE 40 MG/ML IJ SUSP
40.0000 mg | Freq: Once | INTRAMUSCULAR | Status: AC
  Administered 2019-08-07: 15:00:00 40 mg via INTRA_ARTICULAR

## 2019-08-07 NOTE — Assessment & Plan Note (Signed)
Started since October.  Has good range of motion but pain seems more capsular related. -Glenohumeral injection. -Counseled on home exercise therapy and supportive care. -Could consider physical therapy.

## 2019-08-07 NOTE — Progress Notes (Signed)
Maurice Turner - 53 y.o. male MRN [id number]  Date of birth: July 21, 1966  SUBJECTIVE:  Including CC & ROS.  Chief Complaint  Patient presents with  . Shoulder Pain    right shoulder    Maurice Turner is a 53 y.o. male that is presenting with right shoulder pain.  He was wrestling his friend in October and was pushed up against the wall.  Since that time he has had pain in the shoulder.  The pain is intermittent in nature.  No improvement with home modalities.  Pain can be severe and worse when he lies on the affected side.  It is interrupting his ability to work out.  No history of similar pain.  No history of surgery.  Seems localized to the shoulder.  It can be sharp and stabbing.  Can be severe.    Review of Systems  Constitutional: Negative for fever.  HENT: Negative for congestion.   Respiratory: Negative for cough.   Cardiovascular: Negative for chest pain.  Gastrointestinal: Negative for abdominal distention.  Musculoskeletal: Negative for gait problem.  Skin: Negative for color change.  Neurological: Negative for weakness.  Hematological: Negative for adenopathy.    HISTORY: Past Medical, Surgical, Social, and Family History Reviewed & Updated per EMR.   Pertinent Historical Findings include:  Past Medical History:  Diagnosis Date  . Chronic right-sided low back pain with right-sided sciatica   . Diabetes mellitus without complication (HCC)   . Herpes   . HIV (human immunodeficiency virus infection) (HCC)   . Hypertension     No past surgical history on file.  No Known Allergies  Family History  Problem Relation Age of Onset  . Hypertension Mother   . Hypertension Father   . Hypertension Sister   . Hypertension Brother      Social History   Socioeconomic History  . Marital status: Single    Spouse name: Not on file  . Number of children: Not on file  . Years of education: Not on file  . Highest education level: Not on file  Occupational History  .  Not on file  Tobacco Use  . Smoking status: Former Smoker    Types: Cigars  . Smokeless tobacco: Never Used  Substance and Sexual Activity  . Alcohol use: Yes    Comment: occ  . Drug use: Yes    Types: Marijuana  . Sexual activity: Not on file  Other Topics Concern  . Not on file  Social History Narrative  . Not on file   Social Determinants of Health   Financial Resource Strain:   . Difficulty of Paying Living Expenses: Not on file  Food Insecurity:   . Worried About Programme researcher, broadcasting/film/video in the Last Year: Not on file  . Ran Out of Food in the Last Year: Not on file  Transportation Needs:   . Lack of Transportation (Medical): Not on file  . Lack of Transportation (Non-Medical): Not on file  Physical Activity:   . Days of Exercise per Week: Not on file  . Minutes of Exercise per Session: Not on file  Stress:   . Feeling of Stress : Not on file  Social Connections:   . Frequency of Communication with Friends and Family: Not on file  . Frequency of Social Gatherings with Friends and Family: Not on file  . Attends Religious Services: Not on file  . Active Member of Clubs or Organizations: Not on file  . Attends Banker Meetings:  Not on file  . Marital Status: Not on file  Intimate Partner Violence:   . Fear of Current or Ex-Partner: Not on file  . Emotionally Abused: Not on file  . Physically Abused: Not on file  . Sexually Abused: Not on file     PHYSICAL EXAM:  VS: BP 113/75   Pulse 74   Ht 6\' 2"  (1.88 m)   Wt 235 lb (106.6 kg)   BMI 30.17 kg/m  Physical Exam Gen: NAD, alert, cooperative with exam, well-appearing ENT: normal lips, normal nasal mucosa,  Eye: normal EOM, normal conjunctiva and lids CV:  no edema, +2 pedal pulses   Resp: no accessory muscle use, non-labored,   Skin: no rashes, no areas of induration  Neuro: normal tone, normal sensation to touch Psych:  normal insight, alert and oriented MSK:  Right shoulder: Normal active  flexion and abduction. Normal internal and external rotation. Pain with external rotation and abduction. Normal empty can testing. Negative speeds test. Pain with O'Brien's test. Normal grip strength. Neurovascularly intact  Limited ultrasound: Right shoulder:  Normal-appearing biceps tendon and short and long axis. Normal subscapularis Supraspinatus is mild chronic changes.  Normal dynamic testing. Posterior glenohumeral joint has a suggestion of an effusion or capsular thickening  Summary: Findings suggest small effusion or capsular thickening.  Ultrasound and interpretation by Clearance Coots, MD   Aspiration/Injection Procedure Note Maurice Turner May 23, 1966  Procedure: Injection Indications: Right shoulder pain  Procedure Details Consent: Risks of procedure as well as the alternatives and risks of each were explained to the (patient/caregiver).  Consent for procedure obtained. Time Out: Verified patient identification, verified procedure, site/side was marked, verified correct patient position, special equipment/implants available, medications/allergies/relevent history reviewed, required imaging and test results available.  Performed.  The area was cleaned with iodine and alcohol swabs.    The right glenohumeral joint was injected using 3 cc of 1% lidocaine without epinephrine to anesthetize the skin in the tract on a 3-1/2 inch 22-gauge needle.  The syringe was switched and a mixture containing 1 cc's of 40 mg Kenalog and 4 cc's of 0.25% bupivacaine was injected.  Ultrasound was used. Images were obtained in short views showing the injection.     A sterile dressing was applied.  Patient did tolerate procedure well.    ASSESSMENT & PLAN:   Capsulitis of right shoulder Started since October.  Has good range of motion but pain seems more capsular related. -Glenohumeral injection. -Counseled on home exercise therapy and supportive care. -Could consider physical  therapy.

## 2019-08-07 NOTE — Patient Instructions (Signed)
Good to see you Please try ice  Please try the exercises  Please send me a message in MyChart with any questions or updates.  Please see me back in 4 weeks or sooner if needed.   --Dr. Raeford Razor

## 2019-08-15 ENCOUNTER — Telehealth: Payer: Self-pay | Admitting: Family Medicine

## 2019-08-15 NOTE — Telephone Encounter (Signed)
Patient called stating that he is still having pain in his right shoulder despite the injection. States he did not have any relief from the injection.

## 2019-08-16 MED ORDER — CYCLOBENZAPRINE HCL 10 MG PO TABS: ORAL_TABLET | ORAL | 0 refills | Status: AC

## 2019-08-16 MED ORDER — IBUPROFEN 600 MG PO TABS: 600.0000 mg | ORAL_TABLET | Freq: Three times a day (TID) | ORAL | 0 refills | Status: DC | PRN

## 2019-08-16 NOTE — Telephone Encounter (Signed)
Spoke with patient about symptoms. Will provide ibuprofen and flexeril.   Rosemarie Ax, MD [facility] 08/16/2019, 9:04 AM

## 2019-09-04 ENCOUNTER — Ambulatory Visit (INDEPENDENT_AMBULATORY_CARE_PROVIDER_SITE_OTHER): Payer: 59 | Admitting: Family Medicine

## 2019-09-04 ENCOUNTER — Other Ambulatory Visit: Payer: Self-pay

## 2019-09-04 DIAGNOSIS — M778 Other enthesopathies, not elsewhere classified: Secondary | ICD-10-CM | POA: Diagnosis not present

## 2019-09-04 NOTE — Progress Notes (Signed)
Maurice Turner - 54 y.o. male MRN [id number]  Date of birth: 08/10/1966  SUBJECTIVE:  Including CC & ROS.  Chief Complaint  Patient presents with  . Follow-up    follow up for right shoulder    Maurice Turner is a 54 y.o. male that is following up for his right shoulder pain.  He received a glenohumeral injection on 12/14 and reports some improvement of his pain.  He still feels the pain anteriorly with sudden movements.  Denies any radicular symptoms.  The pain can be sharp in nature.  Seems to be worse if he lies on the affected side..   Review of Systems See HPI   HISTORY: Past Medical, Surgical, Social, and Family History Reviewed & Updated per EMR.   Pertinent Historical Findings include:  Past Medical History:  Diagnosis Date  . Chronic right-sided low back pain with right-sided sciatica   . Diabetes mellitus without complication ([place])   . Herpes   . HIV (human immunodeficiency virus infection) ([place])   . Hypertension     No past surgical history on file.  No Known Allergies  Family History  Problem Relation Age of Onset  . Hypertension Mother   . Hypertension Father   . Hypertension Sister   . Hypertension Brother      Social History   Socioeconomic History  . Marital status: Single    Spouse name: Not on file  . Number of children: Not on file  . Years of education: Not on file  . Highest education level: Not on file  Occupational History  . Not on file  Tobacco Use  . Smoking status: Former Smoker    Types: Cigars  . Smokeless tobacco: Never Used  Substance and Sexual Activity  . Alcohol use: Yes    Comment: occ  . Drug use: Yes    Types: Marijuana  . Sexual activity: Not on file  Other Topics Concern  . Not on file  Social History Narrative  . Not on file   Social Determinants of Health   Financial Resource Strain:   . Difficulty of Paying Living Expenses: Not on file  Food Insecurity:   . Worried About Charity fundraiser in the Last  Year: Not on file  . Ran Out of Food in the Last Year: Not on file  Transportation Needs:   . Lack of Transportation (Medical): Not on file  . Lack of Transportation (Non-Medical): Not on file  Physical Activity:   . Days of Exercise per Week: Not on file  . Minutes of Exercise per Session: Not on file  Stress:   . Feeling of Stress : Not on file  Social Connections:   . Frequency of Communication with Friends and Family: Not on file  . Frequency of Social Gatherings with Friends and Family: Not on file  . Attends Religious Services: Not on file  . Active Member of Clubs or Organizations: Not on file  . Attends Archivist Meetings: Not on file  . Marital Status: Not on file  Intimate Partner Violence:   . Fear of Current or Ex-Partner: Not on file  . Emotionally Abused: Not on file  . Physically Abused: Not on file  . Sexually Abused: Not on file     PHYSICAL EXAM:  VS: BP 119/83   Pulse 71   Ht 6\' 1"  (1.854 m)   Wt 230 lb (104.3 kg)   BMI 30.34 kg/m  Physical Exam Gen: NAD, alert, cooperative with  exam, well-appearing ENT: normal lips, normal nasal mucosa,  Eye: normal EOM, normal conjunctiva and lids Skin: no rashes, no areas of induration  Neuro: normal tone, normal sensation to touch Psych:  normal insight, alert and oriented MSK:  Right shoulder: Normal internal and external rotation. Normal flexion and abduction. Normal strength resistance. Some pain with external rotation and abduction. Some pain with internal rotation and abduction. Neurovascularly intact     ASSESSMENT & PLAN:   Capsulitis of right shoulder Pain is improved but still occurring.  May be more rotator cuff with improvement with the glenohumeral injection. -Provided samples of Duexis. -Counseled on home exercise therapy and supportive care. -Could consider subacromial injection.

## 2019-09-04 NOTE — Patient Instructions (Signed)
Good to see you Please use the sling intermittently  Please try ice  Please take the duexis for 4 days straight and then as needed  Please let me know if your pain still isn't controlled   Please send me a message in MyChart with any questions or updates.  Please see me back in 4 weeks.   --Dr. Jordan Likes

## 2019-09-05 NOTE — Assessment & Plan Note (Signed)
Pain is improved but still occurring.  May be more rotator cuff with improvement with the glenohumeral injection. -Provided samples of Duexis. -Counseled on home exercise therapy and supportive care. -Could consider subacromial injection.

## 2019-09-22 ENCOUNTER — Ambulatory Visit: Payer: 59 | Admitting: Family Medicine

## 2019-09-22 NOTE — Progress Notes (Deleted)
  Maurice Turner - 54 y.o. male MRN [id number]  Date of birth: [birth year]  SUBJECTIVE:  Including CC & ROS.  No chief complaint on file.   Maurice Turner is a 54 y.o. male that is  ***.  ***   Review of Systems See HPI   HISTORY: Past Medical, Surgical, Social, and Family History Reviewed & Updated per EMR.   Pertinent Historical Findings include:  Past Medical History:  Diagnosis Date  . Chronic right-sided low back pain with right-sided sciatica   . Diabetes mellitus without complication (HCC)   . Herpes   . HIV (human immunodeficiency virus infection) (HCC)   . Hypertension     No past surgical history on file.  No Known Allergies  Family History  Problem Relation Age of Onset  . Hypertension Mother   . Hypertension Father   . Hypertension Sister   . Hypertension Brother      Social History   Socioeconomic History  . Marital status: Single    Spouse name: Not on file  . Number of children: Not on file  . Years of education: Not on file  . Highest education level: Not on file  Occupational History  . Not on file  Tobacco Use  . Smoking status: Former Smoker    Types: Cigars  . Smokeless tobacco: Never Used  Substance and Sexual Activity  . Alcohol use: Yes    Comment: occ  . Drug use: Yes    Types: Marijuana  . Sexual activity: Not on file  Other Topics Concern  . Not on file  Social History Narrative  . Not on file   Social Determinants of Health   Financial Resource Strain:   . Difficulty of Paying Living Expenses: Not on file  Food Insecurity:   . Worried About Programme researcher, broadcasting/film/video in the Last Year: Not on file  . Ran Out of Food in the Last Year: Not on file  Transportation Needs:   . Lack of Transportation (Medical): Not on file  . Lack of Transportation (Non-Medical): Not on file  Physical Activity:   . Days of Exercise per Week: Not on file  . Minutes of Exercise per Session: Not on file  Stress:   . Feeling of Stress : Not on  file  Social Connections:   . Frequency of Communication with Friends and Family: Not on file  . Frequency of Social Gatherings with Friends and Family: Not on file  . Attends Religious Services: Not on file  . Active Member of Clubs or Organizations: Not on file  . Attends Banker Meetings: Not on file  . Marital Status: Not on file  Intimate Partner Violence:   . Fear of Current or Ex-Partner: Not on file  . Emotionally Abused: Not on file  . Physically Abused: Not on file  . Sexually Abused: Not on file     PHYSICAL EXAM:  VS: There were no vitals taken for this visit. Physical Exam Gen: NAD, alert, cooperative with exam, well-appearing ENT: normal lips, normal nasal mucosa,  Eye: normal EOM, normal conjunctiva and lids Skin: no rashes, no areas of induration  Neuro: normal tone, normal sensation to touch Psych:  normal insight, alert and oriented MSK:  ***      ASSESSMENT & PLAN:   No problem-specific Assessment & Plan notes found for this encounter.

## 2019-09-26 ENCOUNTER — Encounter: Payer: Self-pay | Admitting: Family Medicine

## 2019-09-26 ENCOUNTER — Ambulatory Visit (INDEPENDENT_AMBULATORY_CARE_PROVIDER_SITE_OTHER): Payer: 59 | Admitting: Family Medicine

## 2019-09-26 ENCOUNTER — Other Ambulatory Visit: Payer: Self-pay

## 2019-09-26 DIAGNOSIS — S43431D Superior glenoid labrum lesion of right shoulder, subsequent encounter: Secondary | ICD-10-CM

## 2019-09-26 MED ORDER — HYDROCODONE-ACETAMINOPHEN 5-325 MG PO TABS: 1.0000 | ORAL_TABLET | Freq: Three times a day (TID) | ORAL | 0 refills | Status: AC | PRN

## 2019-09-26 NOTE — Assessment & Plan Note (Signed)
Concerned that if this may be a labral tear as opposed to capsulitis with the initial trauma that his pain began with. -Counseled on home exercise therapy and supportive care. -Norco and counseled on its use. -If no improvement consider MRI to evaluate for labral tear.

## 2019-09-26 NOTE — Patient Instructions (Signed)
Good to see you Please use the pain medication when the pain is severe  Please try ice  Please try the exercises   Please send me a message in MyChart with any questions or updates.  Please see me back in 4 weeks.   --Dr. Jordan Likes

## 2019-09-26 NOTE — Progress Notes (Signed)
Maurice Turner - 54 y.o. male MRN [id number]  Date of birth: 1966-07-02  SUBJECTIVE:  Including CC & ROS.  Chief Complaint  Patient presents with  . Follow-up    follow up for right shoulder    Maurice Turner is a 54 y.o. male that is presenting with ongoing right shoulder pain. He initially had his shoulder run into the wall. We have tried a glenohumeral injection and medication with limited improvement. He is continue to the home exercises. Pain is most notable when he lies on the affected side.   Review of Systems See HPI   HISTORY: Past Medical, Surgical, Social, and Family History Reviewed & Updated per EMR.   Pertinent Historical Findings include:  Past Medical History:  Diagnosis Date  . Chronic right-sided low back pain with right-sided sciatica   . Diabetes mellitus without complication (HCC)   . Herpes   . HIV (human immunodeficiency virus infection) (HCC)   . Hypertension     No past surgical history on file.  No Known Allergies  Family History  Problem Relation Age of Onset  . Hypertension Mother   . Hypertension Father   . Hypertension Sister   . Hypertension Brother      Social History   Socioeconomic History  . Marital status: Single    Spouse name: Not on file  . Number of children: Not on file  . Years of education: Not on file  . Highest education level: Not on file  Occupational History  . Not on file  Tobacco Use  . Smoking status: Former Smoker    Types: Cigars  . Smokeless tobacco: Never Used  Substance and Sexual Activity  . Alcohol use: Yes    Comment: occ  . Drug use: Yes    Types: Marijuana  . Sexual activity: Not on file  Other Topics Concern  . Not on file  Social History Narrative  . Not on file   Social Determinants of Health   Financial Resource Strain:   . Difficulty of Paying Living Expenses: Not on file  Food Insecurity:   . Worried About Programme researcher, broadcasting/film/video in the Last Year: Not on file  . Ran Out of Food in  the Last Year: Not on file  Transportation Needs:   . Lack of Transportation (Medical): Not on file  . Lack of Transportation (Non-Medical): Not on file  Physical Activity:   . Days of Exercise per Week: Not on file  . Minutes of Exercise per Session: Not on file  Stress:   . Feeling of Stress : Not on file  Social Connections:   . Frequency of Communication with Friends and Family: Not on file  . Frequency of Social Gatherings with Friends and Family: Not on file  . Attends Religious Services: Not on file  . Active Member of Clubs or Organizations: Not on file  . Attends Banker Meetings: Not on file  . Marital Status: Not on file  Intimate Partner Violence:   . Fear of Current or Ex-Partner: Not on file  . Emotionally Abused: Not on file  . Physically Abused: Not on file  . Sexually Abused: Not on file     PHYSICAL EXAM:  VS: BP (!) 143/88   Pulse 88   Ht 6\' 2"  (1.88 m)   Wt 230 lb (104.3 kg)   BMI 29.53 kg/m  Physical Exam Gen: NAD, alert, cooperative with exam, well-appearing ENT: normal lips, normal nasal mucosa,  Eye: normal EOM,  normal conjunctiva and lids Skin: no rashes, no areas of induration  Neuro: normal tone, normal sensation to touch Psych:  normal insight, alert and oriented MSK:  Right shoulder: Some limitations with flexion. Normal internal and external rotation. Neurovascular intact     ASSESSMENT & PLAN:   Labral tear of shoulder, right, subsequent encounter Concerned that if this may be a labral tear as opposed to capsulitis with the initial trauma that his pain began with. -Counseled on home exercise therapy and supportive care. -Norco and counseled on its use. -If no improvement consider MRI to evaluate for labral tear.

## 2019-10-02 ENCOUNTER — Ambulatory Visit: Payer: 59 | Admitting: Family Medicine

## 2019-10-24 ENCOUNTER — Ambulatory Visit: Payer: 59 | Admitting: Family Medicine

## 2019-12-05 ENCOUNTER — Other Ambulatory Visit: Payer: Self-pay

## 2019-12-05 ENCOUNTER — Encounter (HOSPITAL_BASED_OUTPATIENT_CLINIC_OR_DEPARTMENT_OTHER): Payer: Self-pay | Admitting: *Deleted

## 2019-12-05 ENCOUNTER — Emergency Department (HOSPITAL_BASED_OUTPATIENT_CLINIC_OR_DEPARTMENT_OTHER): Payer: 59

## 2019-12-05 ENCOUNTER — Emergency Department (HOSPITAL_BASED_OUTPATIENT_CLINIC_OR_DEPARTMENT_OTHER)
Admission: EM | Admit: 2019-12-05 | Discharge: 2019-12-05 | Disposition: A | Discharge status: Home / Self Care | Payer: 59 | Attending: Emergency Medicine | Admitting: Emergency Medicine

## 2019-12-05 DIAGNOSIS — Z87891 Personal history of nicotine dependence: Secondary | ICD-10-CM | POA: Insufficient documentation

## 2019-12-05 DIAGNOSIS — B20 Human immunodeficiency virus [HIV] disease: Secondary | ICD-10-CM | POA: Diagnosis not present

## 2019-12-05 DIAGNOSIS — Z79899 Other long term (current) drug therapy: Secondary | ICD-10-CM | POA: Insufficient documentation

## 2019-12-05 DIAGNOSIS — R197 Diarrhea, unspecified: Secondary | ICD-10-CM | POA: Diagnosis not present

## 2019-12-05 DIAGNOSIS — R109 Unspecified abdominal pain: Secondary | ICD-10-CM | POA: Diagnosis not present

## 2019-12-05 DIAGNOSIS — E119 Type 2 diabetes mellitus without complications: Secondary | ICD-10-CM | POA: Insufficient documentation

## 2019-12-05 DIAGNOSIS — F1210 Cannabis abuse, uncomplicated: Secondary | ICD-10-CM | POA: Insufficient documentation

## 2019-12-05 DIAGNOSIS — I10 Essential (primary) hypertension: Secondary | ICD-10-CM | POA: Diagnosis not present

## 2019-12-05 DIAGNOSIS — Z7984 Long term (current) use of oral hypoglycemic drugs: Secondary | ICD-10-CM | POA: Diagnosis not present

## 2019-12-05 LAB — COMPREHENSIVE METABOLIC PANEL
ALT: 34 U/L (ref 0–44)
AST: 27 U/L (ref 15–41)
Albumin: 4.7 g/dL (ref 3.5–5.0)
Alkaline Phosphatase: 65 U/L (ref 38–126)
Anion gap: 13 (ref 5–15)
BUN: 13 mg/dL (ref 6–20)
CO2: 19 mmol/L — ABNORMAL LOW (ref 22–32)
Calcium: 9.3 mg/dL (ref 8.9–10.3)
Chloride: 103 mmol/L (ref 98–111)
Creatinine, Ser: 1.18 mg/dL (ref 0.61–1.24)
GFR calc Af Amer: >60 mL/min (ref >60)
GFR calc non Af Amer: >60 mL/min (ref >60)
Glucose, Bld: 212 mg/dL — ABNORMAL HIGH (ref 70–99)
Potassium: 3.9 mmol/L (ref 3.5–5.1)
Sodium: 135 mmol/L (ref 135–145)
Total Bilirubin: 1.8 mg/dL — ABNORMAL HIGH (ref 0.3–1.2)
Total Protein: 7.8 g/dL (ref 6.5–8.1)

## 2019-12-05 LAB — CBC WITH DIFFERENTIAL/PLATELET
Abs Immature Granulocytes: 0.04 10*3/uL (ref 0.00–0.07)
Basophils Absolute: 0 10*3/uL (ref 0.0–0.1)
Basophils Relative: 0 %
Eosinophils Absolute: 0 10*3/uL (ref 0.0–0.5)
Eosinophils Relative: 0 %
HCT: 47.2 % (ref 39.0–52.0)
Hemoglobin: 16.5 g/dL (ref 13.0–17.0)
Immature Granulocytes: 0 %
Lymphocytes Relative: 19 %
Lymphs Abs: 2.1 10*3/uL (ref 0.7–4.0)
MCH: 34.1 pg — ABNORMAL HIGH (ref 26.0–34.0)
MCHC: 35 g/dL (ref 30.0–36.0)
MCV: 97.5 fL (ref 80.0–100.0)
Monocytes Absolute: 0.8 10*3/uL (ref 0.1–1.0)
Monocytes Relative: 8 %
Neutro Abs: 7.7 10*3/uL (ref 1.7–7.7)
Neutrophils Relative %: 73 %
Platelets: 212 10*3/uL (ref 150–400)
RBC: 4.84 MIL/uL (ref 4.22–5.81)
RDW: 11.7 % (ref 11.5–15.5)
WBC: 10.7 10*3/uL — ABNORMAL HIGH (ref 4.0–10.5)
nRBC: 0 % (ref 0.0–0.2)

## 2019-12-05 LAB — URINALYSIS, ROUTINE W REFLEX MICROSCOPIC
Bilirubin Urine: NEGATIVE
Glucose, UA: 250 mg/dL — AB
Hgb urine dipstick: NEGATIVE
Ketones, ur: 15 mg/dL — AB
Leukocytes,Ua: NEGATIVE
Nitrite: NEGATIVE
Protein, ur: NEGATIVE mg/dL
Specific Gravity, Urine: >1.03 — ABNORMAL HIGH (ref 1.005–1.030)
pH: 5.5 (ref 5.0–8.0)

## 2019-12-05 LAB — LIPASE, BLOOD: Lipase: 25 U/L (ref 11–51)

## 2019-12-05 MED ORDER — SODIUM CHLORIDE 0.9 % IV BOLUS
1000.0000 mL | Freq: Once | INTRAVENOUS | Status: AC
  Administered 2019-12-05: 1000 mL via INTRAVENOUS

## 2019-12-05 MED ORDER — HYDROMORPHONE HCL 1 MG/ML IJ SOLN
1.0000 mg | Freq: Once | INTRAMUSCULAR | Status: AC
  Administered 2019-12-05: 1 mg via INTRAVENOUS
  Filled 2019-12-05: qty 1

## 2019-12-05 MED ORDER — IOHEXOL 300 MG/ML  SOLN
100.0000 mL | Freq: Once | INTRAMUSCULAR | Status: AC | PRN
  Administered 2019-12-05: 100 mL via INTRAVENOUS

## 2019-12-05 MED ORDER — NAPROXEN 500 MG PO TABS: 500.0000 mg | ORAL_TABLET | Freq: Two times a day (BID) | ORAL | 0 refills | Status: DC | PRN

## 2019-12-05 NOTE — ED Triage Notes (Signed)
Pt c/o abd cramping x 1 day with diarrhea.

## 2019-12-05 NOTE — ED Notes (Signed)
ED Provider at bedside. 

## 2019-12-05 NOTE — ED Provider Notes (Signed)
[facility] EMERGENCY DEPARTMENT Provider Note   CSN: [id number] Arrival date & time: 12/05/19  1705     History Chief Complaint  Patient presents with  . Abdominal Cramping    Maurice Turner is a 54 y.o. male.  Presenting to emergency department with chief complaint abdominal pain, diarrhea.  Symptoms ongoing for last few days, worse over the past 24 hours.  Has had approximately 12 episodes of loose stool.  Not watery.  [place].  No blood.  Has had episodic right flank and right-sided abdominal pain associated with the diarrhea.  No alleviating or aggravating factors.  Has not taken any medication for his pain.  Currently mild but will become severe at times.  Type 2 diabetes not on insulin, denies prior surgical abdominal history.  HPI     Past Medical History:  Diagnosis Date  . Chronic right-sided low back pain with right-sided sciatica   . Diabetes mellitus without complication ([place])   . Herpes   . HIV (human immunodeficiency virus infection) ([place])   . Hypertension     Patient Active Problem List   Diagnosis Date Noted  . Labral tear of shoulder, right, subsequent encounter 08/07/2019  . Rib pain 02/05/2019  . HIV disease ([place]) 08/31/2016  . Type 2 diabetes mellitus without complication ([place]) 18/29/9371  . Essential hypertension with goal blood pressure less than 130/80 01/19/2014  . Low back pain 11/22/2011  . Herpes simplex type 2 infection 06/01/2011  . 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 No pertinent surgical history.     Family History  Problem Relation Age of Onset  . Hypertension Mother   . Hypertension Father   . Hypertension Sister   . Hypertension Brother     Social History   Tobacco Use  . Smoking status: Former Smoker    Types: Cigars  . Smokeless tobacco: Never Used  Substance Use Topics  . Alcohol use: Yes    Comment: occ  . Drug use: Yes    Types: Marijuana    Home Medications Prior to  Admission medications   Medication Sig Start Date End Date Taking? Authorizing Provider  cyclobenzaprine (FLEXERIL) 10 MG tablet One half tab PO qHS, then increase gradually to one tab TID. 08/16/19   Rosemarie Ax, MD  HYDROcodone-acetaminophen (NORCO/VICODIN) 5-325 MG tablet Take 1 tablet by mouth every 8 (eight) hours as needed for moderate pain. 09/26/19   Rosemarie Ax, MD  ibuprofen (ADVIL) 600 MG tablet Take 1 tablet (600 mg total) by mouth every 8 (eight) hours as needed. 08/16/19   Rosemarie Ax, MD  lisinopril (PRINIVIL,ZESTRIL) 10 MG tablet Take 10 mg by mouth. 12/12/15 05/29/19  [provider]  metFORMIN (GLUCOPHAGE) 500 MG tablet Take 500 mg by mouth. 06/02/16   [provider]  naproxen (NAPROSYN) 500 MG tablet Take 1 tablet (500 mg total) by mouth 2 (two) times daily as needed. 12/05/19   Lucrezia Starch, MD  valACYclovir (VALTREX) 1000 MG tablet TAKE 1 TABLET BY MOUTH DAILY 08/19/16   [provider]    Allergies    Patient has no known allergies.  Review of Systems   Review of Systems  Constitutional: Negative for chills and fever.  HENT: Negative for ear pain and sore throat.   Eyes: Negative for pain and visual disturbance.  Respiratory: Negative for cough and shortness of breath.   Cardiovascular: Negative for chest pain and palpitations.  Gastrointestinal: Positive for abdominal pain and  diarrhea. Negative for vomiting.  Genitourinary: Negative for dysuria and hematuria.  Musculoskeletal: Negative for arthralgias and back pain.  Skin: Negative for color change and rash.  Neurological: Negative for seizures and syncope.  All other systems reviewed and are negative.   Physical Exam Updated Vital Signs BP 127/88 (BP Location: Right Arm)   Pulse 78   Temp 98.3 F (36.8 C) (Oral)   Resp 19   Ht 6\' 2"  (1.88 m)   Wt 102.1 kg   SpO2 100%   BMI 28.89 kg/m   Physical Exam Vitals and nursing note reviewed.  Constitutional:        Appearance: He is well-developed.  HENT:     Head: Normocephalic and atraumatic.  Eyes:     Conjunctiva/sclera: Conjunctivae normal.  Cardiovascular:     Rate and Rhythm: Normal rate and regular rhythm.     Heart sounds: No murmur.  Pulmonary:     Effort: Pulmonary effort is normal. No respiratory distress.     Breath sounds: Normal breath sounds.  Abdominal:     Palpations: Abdomen is soft.     Tenderness: There is no guarding or rebound.     Comments: Noted mild TTP in right mid and right lower abdomen  Musculoskeletal:        General: No swelling or deformity.     Cervical back: Neck supple.  Skin:    General: Skin is warm and dry.     Capillary Refill: Capillary refill takes less than 2 seconds.  Neurological:     Mental Status: He is alert.     ED Results / Procedures / Treatments   Labs (all labs ordered are listed, but only abnormal results are displayed) Labs Reviewed  CBC WITH DIFFERENTIAL/PLATELET - Abnormal; Notable for the following components:      Result Value   WBC 10.7 (*)    MCH 34.1 (*)    All other components within normal limits  COMPREHENSIVE METABOLIC PANEL - Abnormal; Notable for the following components:   CO2 19 (*)    Glucose, Bld 212 (*)    Total Bilirubin 1.8 (*)    All other components within normal limits  URINALYSIS, ROUTINE W REFLEX MICROSCOPIC - Abnormal; Notable for the following components:   Specific Gravity, Urine >1.030 (*)    Glucose, UA 250 (*)    Ketones, ur 15 (*)    All other components within normal limits  LIPASE, BLOOD    EKG None  Radiology CT ABDOMEN PELVIS W CONTRAST  Result Date: 12/05/2019 CLINICAL DATA:  Abdominal cramping and diarrhea for 1 day. EXAM: CT ABDOMEN AND PELVIS WITH CONTRAST TECHNIQUE: Multidetector CT imaging of the abdomen and pelvis was performed using the standard protocol following bolus administration of intravenous contrast. CONTRAST:  12/07/2019 OMNIPAQUE IOHEXOL 300 MG/ML  SOLN COMPARISON:   None. FINDINGS: Lower chest: The lung bases are clear of acute process. No pleural effusion or pulmonary lesions. The heart is normal in size. No pericardial effusion. The distal esophagus and aorta are unremarkable. Hepatobiliary: No focal hepatic lesions or intrahepatic biliary dilatation. Diffuse fatty infiltration is noted. The gallbladder is normal. No common bile duct dilatation. Pancreas: No mass, inflammation or ductal dilatation. Spleen: Normal size. No focal lesions. Adrenals/Urinary Tract: The adrenal glands are unremarkable. Lower pole left renal calculus but no obstructing ureteral calculi or hydroureteronephrosis. No worrisome bladder or renal lesions are identified. No CT findings for pyelonephritis. Stomach/Bowel: The stomach, duodenum, small bowel and colon are grossly normal without  oral contrast. No inflammatory changes, mass lesions or obstructive findings. The appendix is normal. Vascular/Lymphatic: The aorta is normal in caliber. No dissection. The branch vessels are patent. The major venous structures are patent. No mesenteric or retroperitoneal mass or adenopathy. Small scattered lymph nodes are noted. Reproductive: The prostate gland and seminal vesicles are unremarkable. Other: No pelvic mass or adenopathy. No free pelvic fluid collections. No inguinal mass or adenopathy. No abdominal wall hernia or subcutaneous lesions. Musculoskeletal: No significant bony findings. Moderate degenerative disc disease noted at L4-5. IMPRESSION: 1. No acute abdominal/pelvic findings, mass lesions or adenopathy. 2. Lower pole left renal calculus but no obstructing ureteral calculi or hydroureteronephrosis. 3. Diffuse fatty infiltration of the liver. Electronically Signed   By: Rudie Meyer M.D.   On: 12/05/2019 20:39    Procedures Procedures (including critical care time)  Medications Ordered in ED Medications  sodium chloride 0.9 % bolus 1,000 mL ( Intravenous Stopped 12/05/19 2129)  HYDROmorphone  (DILAUDID) injection 1 mg (1 mg Intravenous Given 12/05/19 2012)  iohexol (OMNIPAQUE) 300 MG/ML solution 100 mL (100 mLs Intravenous Contrast Given 12/05/19 2023)    ED Course  I have reviewed the triage vital signs and the nursing notes.  Pertinent labs & imaging results that were available during my care of the patient were reviewed by me and considered in my medical decision making (see chart for details).    MDM Rules/Calculators/A&P                      54 year old male presenting to ER With complaints of diarrhea, intermittent crampy abdominal pain.  On exam patient was noted to be well-appearing with normal vital signs.  Lab work grossly normal limits.  Given the tenderness noted on exam, right-sided location, obtain CT scan to rule appendicitis or other acute abdominal process.  CT negative.  Suspect most likely viral GI illness versus MSK muscle cramping.  Recommend supportive care at this time.  Recommend follow-up with primary doctor later this week.  Pain well controlled, tolerating p.o. without difficulty.    After the discussed management above, the patient was determined to be safe for discharge.  The patient was in agreement with this plan and all questions regarding their care were answered.  ED return precautions were discussed and the patient will return to the ED with any significant worsening of condition.   Final Clinical Impression(s) / ED Diagnoses Final diagnoses:  Abdominal cramping    Rx / DC Orders ED Discharge Orders         Ordered    naproxen (NAPROSYN) 500 MG tablet  2 times daily PRN     12/05/19 2146           Milagros Loll, MD 12/06/19 1151

## 2019-12-05 NOTE — Discharge Instructions (Signed)
Recommend taking Tylenol as well as anti-inflammatory such as naproxen or Motrin.  Recommend follow-up with primary doctor regarding the symptoms you are experiencing today.  If you feel that your pain is steadily worsening, you develop vomiting, fever or other new concerning symptom, return to ER for reassessment.

## 2020-07-05 IMAGING — CT CT ABD-PELV W/ CM
2 of 5 series · 16 of 46 positions shown, 18 images · IV contrast (Omnipaque)
Comparison: None.

CLINICAL DATA: Abdominal cramping and diarrhea for 1 day.

EXAM:
CT ABDOMEN AND PELVIS WITH CONTRAST
TECHNIQUE: Multidetector CT imaging of the abdomen and pelvis was performed
using the standard protocol following bolus administration of
intravenous contrast.
CONTRAST:  100mL OMNIPAQUE IOHEXOL 300 MG/ML  SOLN

[Series 2: axial st · axial · 0.82mm/px · z∈[+790,+1200]mm · 13 of 94 slices shown, 15 images]
[im 6/94  soft-tissue]
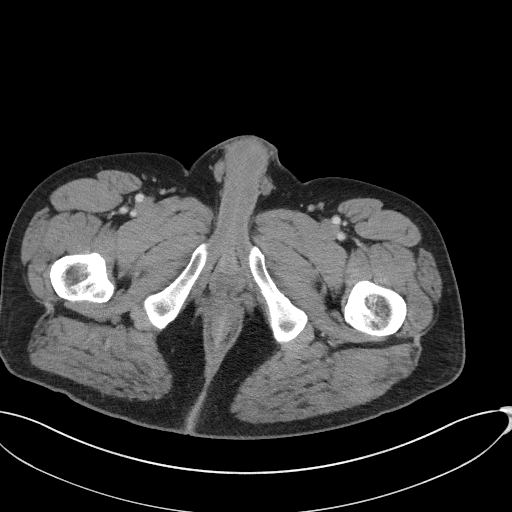
[im 6/94  bone]
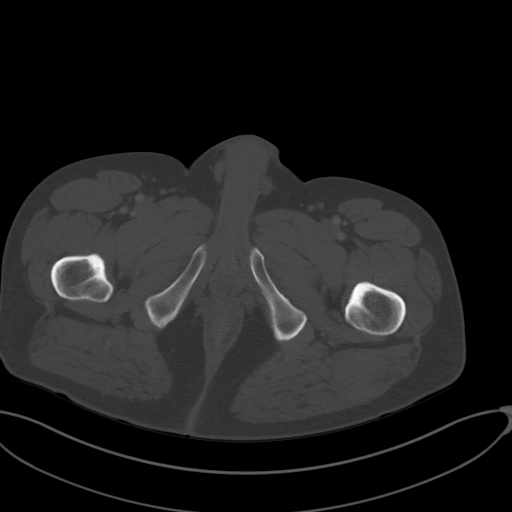
[im 11/94  soft-tissue]
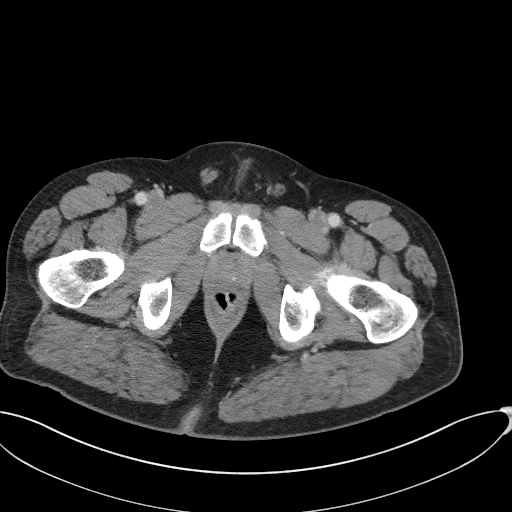
[im 21/94  soft-tissue]
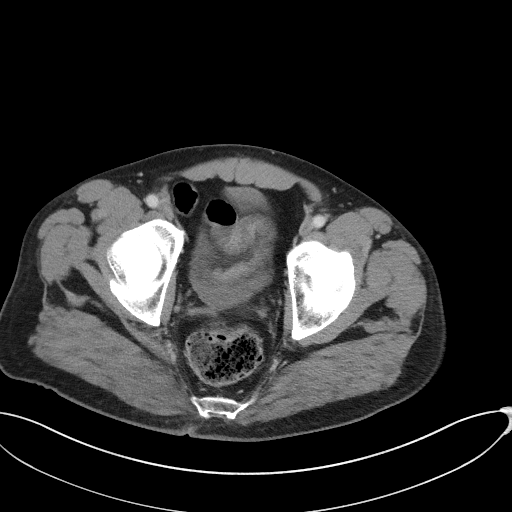
[im 26/94  soft-tissue]
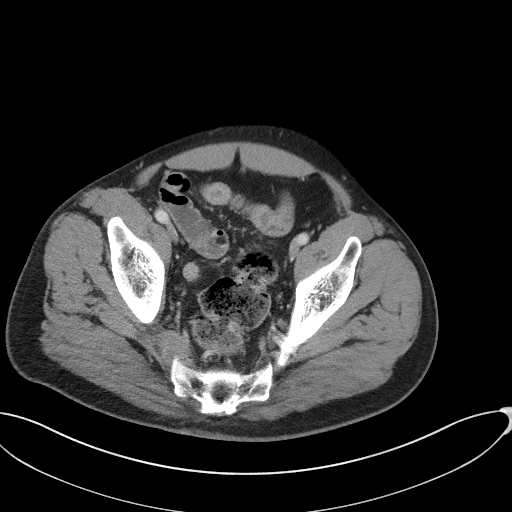
[im 32/94  soft-tissue]
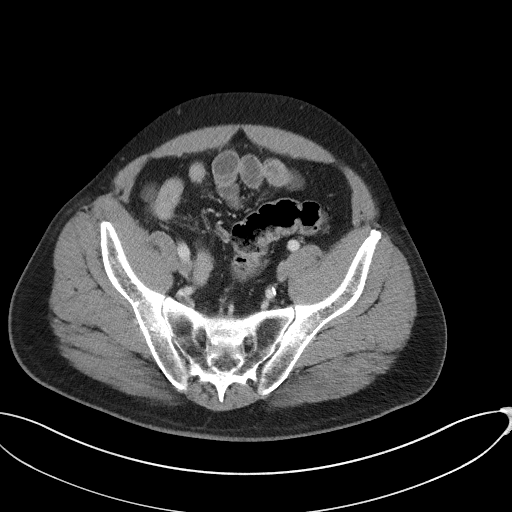
[im 42/94  soft-tissue]
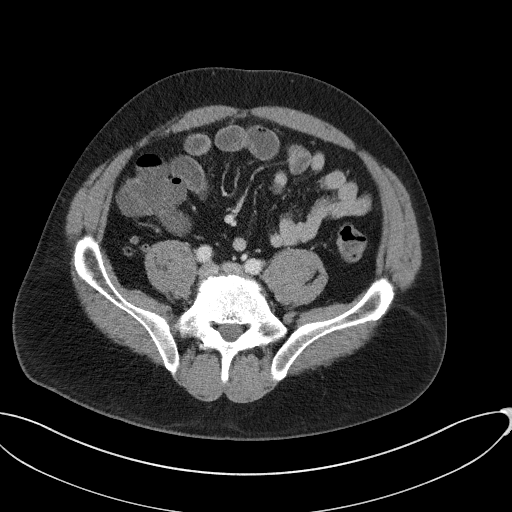
[im 47/94  soft-tissue]
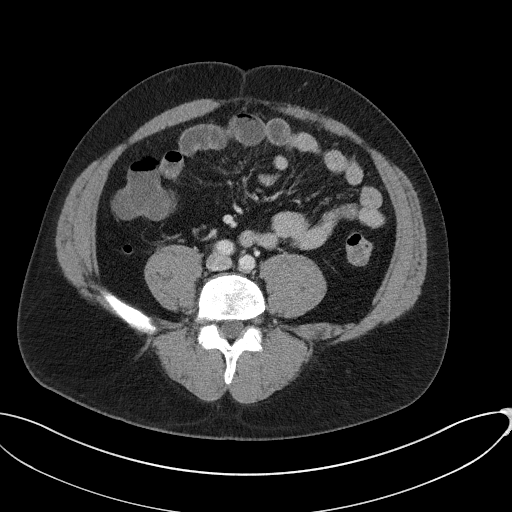
[im 52/94  soft-tissue]
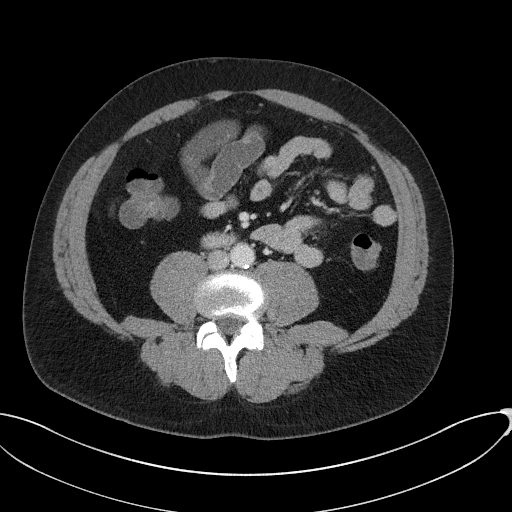
[im 63/94  soft-tissue]
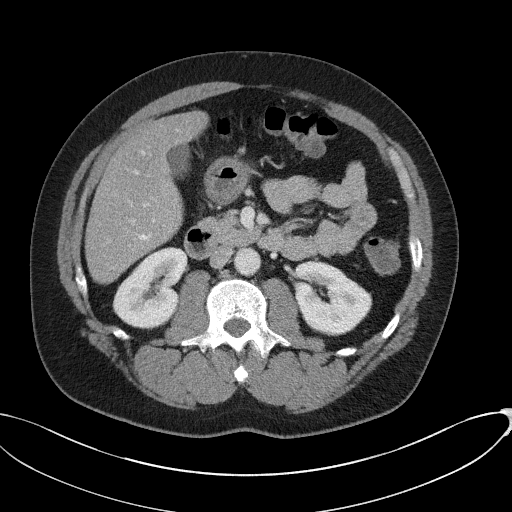
[im 63/94  bone]
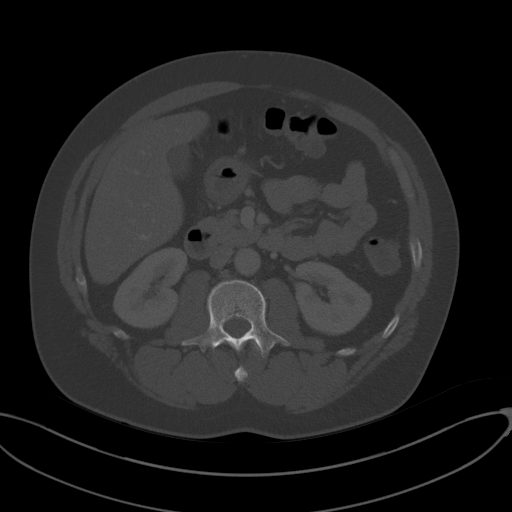
[im 68/94  soft-tissue]
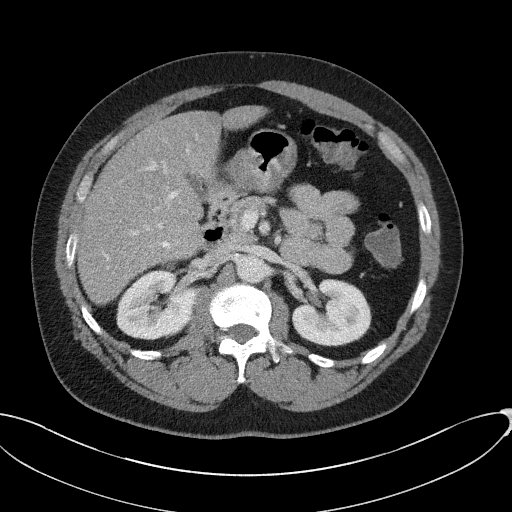
[im 73/94  soft-tissue]
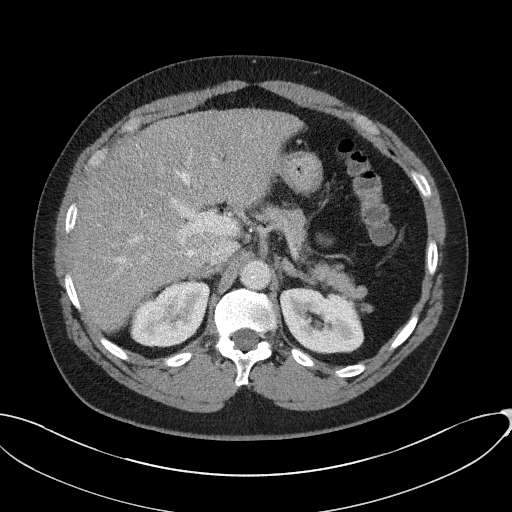
[im 83/94  soft-tissue]
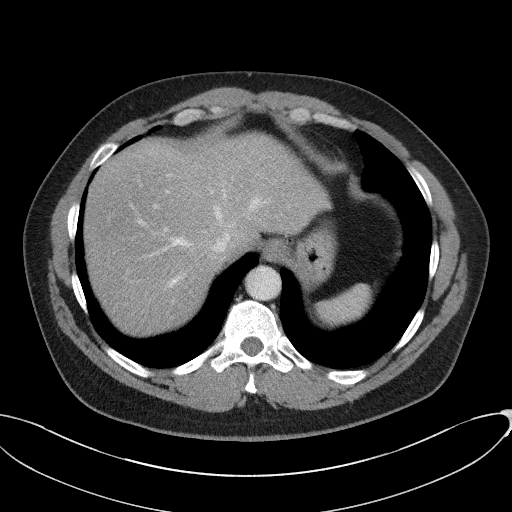
[im 88/94  soft-tissue]
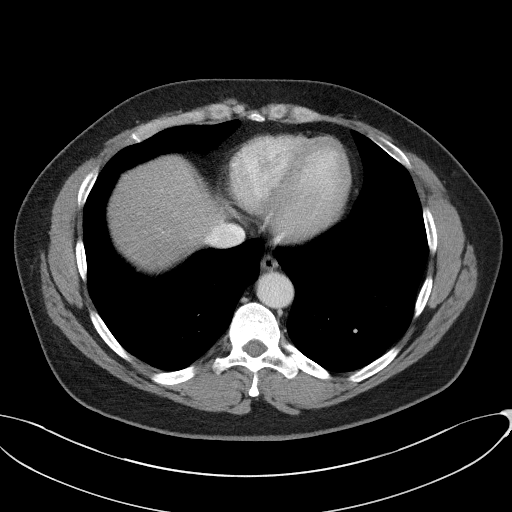

[Series 5: coronal st · coronal · 0.73mm/px · 3 of 110 slices shown]
[im 37/110  soft-tissue]
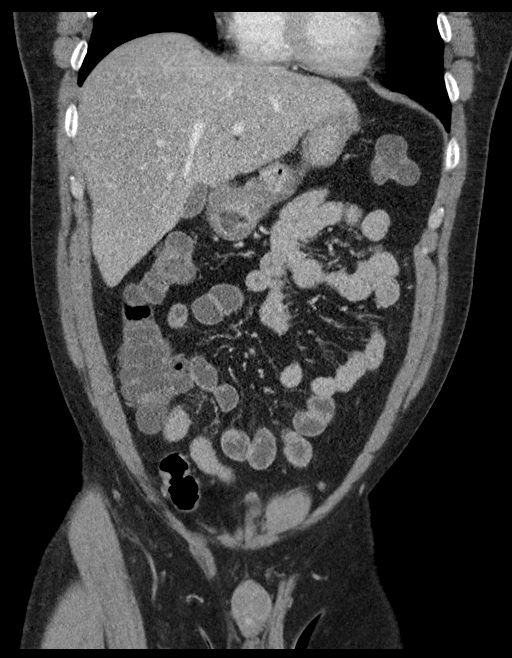
[im 49/110  soft-tissue]
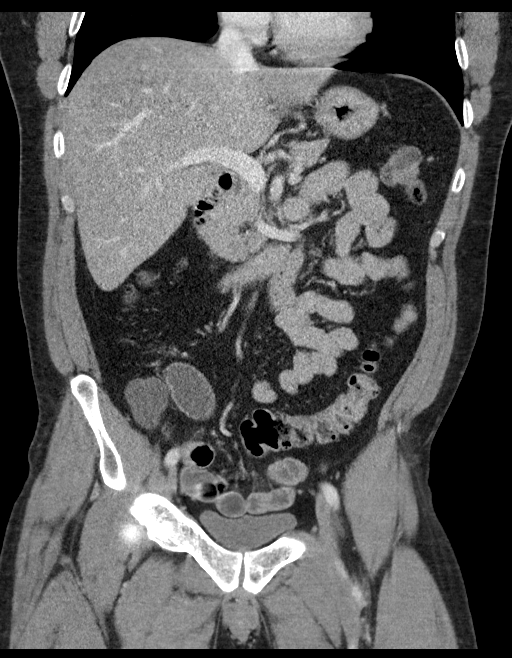
[im 61/110  soft-tissue]
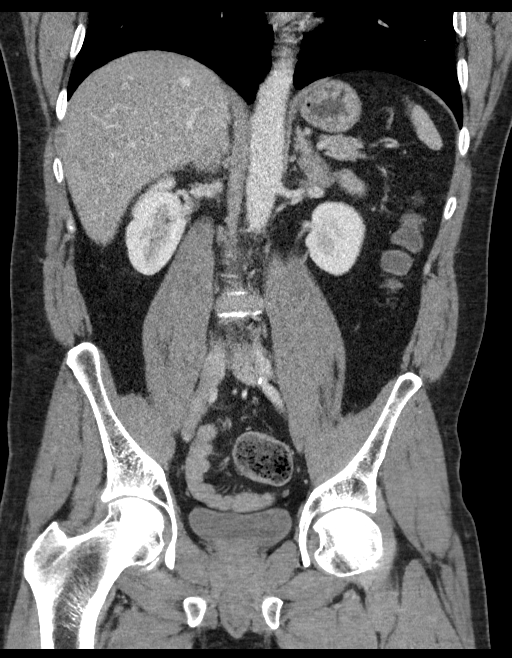

[16 of 46 positions shown; findings below may reference images not displayed]

FINDINGS: Lower chest: The lung bases are clear of acute process. No pleural
effusion or pulmonary lesions. The heart is normal in size. No
pericardial effusion. The distal esophagus and aorta are
unremarkable.

Hepatobiliary: No focal hepatic lesions or intrahepatic biliary
dilatation. Diffuse fatty infiltration is noted. The gallbladder is
normal. No common bile duct dilatation.

Pancreas: No mass, inflammation or ductal dilatation.

Spleen: Normal size. No focal lesions.

Adrenals/Urinary Tract: The adrenal glands are unremarkable.

Lower pole left renal calculus but no obstructing ureteral calculi
or hydroureteronephrosis. No worrisome bladder or renal lesions are
identified. No CT findings for pyelonephritis.

Stomach/Bowel: The stomach, duodenum, small bowel and colon are
grossly normal without oral contrast. No inflammatory changes, mass
lesions or obstructive findings. The appendix is normal.

Vascular/Lymphatic: The aorta is normal in caliber. No dissection.
The branch vessels are patent. The major venous structures are
patent. No mesenteric or retroperitoneal mass or adenopathy. Small
scattered lymph nodes are noted.

Reproductive: The prostate gland and seminal vesicles are
unremarkable.

Other: No pelvic mass or adenopathy. No free pelvic fluid
collections. No inguinal mass or adenopathy. No abdominal wall
hernia or subcutaneous lesions.

Musculoskeletal: No significant bony findings. Moderate degenerative
disc disease noted at L4-5.
IMPRESSION: 1. No acute abdominal/pelvic findings, mass lesions or adenopathy.
2. Lower pole left renal calculus but no obstructing ureteral
calculi or hydroureteronephrosis.
3. Diffuse fatty infiltration of the liver.

## 2022-05-31 ENCOUNTER — Other Ambulatory Visit: Payer: Self-pay

## 2022-05-31 ENCOUNTER — Emergency Department (HOSPITAL_BASED_OUTPATIENT_CLINIC_OR_DEPARTMENT_OTHER)
Admission: EM | Admit: 2022-05-31 | Discharge: 2022-05-31 | Disposition: A | Discharge status: Home / Self Care | Payer: Self-pay | Attending: Emergency Medicine | Admitting: Emergency Medicine

## 2022-05-31 ENCOUNTER — Encounter (HOSPITAL_BASED_OUTPATIENT_CLINIC_OR_DEPARTMENT_OTHER): Payer: Self-pay | Admitting: Emergency Medicine

## 2022-05-31 DIAGNOSIS — E119 Type 2 diabetes mellitus without complications: Secondary | ICD-10-CM | POA: Insufficient documentation

## 2022-05-31 DIAGNOSIS — Z21 Asymptomatic human immunodeficiency virus [HIV] infection status: Secondary | ICD-10-CM | POA: Insufficient documentation

## 2022-05-31 DIAGNOSIS — S30810A Abrasion of lower back and pelvis, initial encounter: Secondary | ICD-10-CM | POA: Insufficient documentation

## 2022-05-31 DIAGNOSIS — K649 Unspecified hemorrhoids: Secondary | ICD-10-CM | POA: Insufficient documentation

## 2022-05-31 DIAGNOSIS — I10 Essential (primary) hypertension: Secondary | ICD-10-CM | POA: Insufficient documentation

## 2022-05-31 DIAGNOSIS — X58XXXA Exposure to other specified factors, initial encounter: Secondary | ICD-10-CM | POA: Insufficient documentation

## 2022-05-31 DIAGNOSIS — Z7984 Long term (current) use of oral hypoglycemic drugs: Secondary | ICD-10-CM | POA: Insufficient documentation

## 2022-05-31 NOTE — ED Provider Notes (Signed)
[facility]ERG[facility] DEPARTMENT Provider Note   CSN: [id number] Arrival date & time: 05/31/22  0025     History  Chief Complaint  Patient presents with   Rectal Pain    Maurice Turner is a 56 y.o. male.  The history is provided by the patient.  Illness Location:  Rectum Quality:  Itching Severity:  Moderate Onset quality:  Gradual Duration:  2 weeks Progression:  Unchanged Chronicity:  New Context:  No reported hard stools Relieved by:  Nothing Worsened by:  Nothing Ineffective treatments:  Plain hydrocortisone cream Associated symptoms: no abdominal pain, no fever, no vomiting and no wheezing   Patient with diabetes presents with rectal itching x 2 weeks. No known hard stools.      Past Medical History:  Diagnosis Date   Chronic right-sided low back pain with right-sided sciatica    Diabetes mellitus without complication ([facility])    Herpes    HIV (human immunodeficiency virus infection) ([place])    Hypertension      Home Medications Prior to Admission medications   Medication Sig Start Date End Date Taking? Authorizing Provider  cyclobenzaprine (FLEXERIL) 10 MG tablet One half tab PO qHS, then increase gradually to one tab TID. 08/16/19   Rosemarie Ax, MD  HYDROcodone-acetaminophen (NORCO/VICODIN) 5-325 MG tablet Take 1 tablet by mouth every 8 (eight) hours as needed for moderate pain. 09/26/19   Rosemarie Ax, MD  ibuprofen (ADVIL) 600 MG tablet Take 1 tablet (600 mg total) by mouth every 8 (eight) hours as needed. 08/16/19   Rosemarie Ax, MD  lisinopril (PRINIVIL,ZESTRIL) 10 MG tablet Take 10 mg by mouth. 12/12/15 05/29/19  [provider]  metFORMIN (GLUCOPHAGE) 500 MG tablet Take 500 mg by mouth. 06/02/16   [provider]  naproxen (NAPROSYN) 500 MG tablet Take 1 tablet (500 mg total) by mouth 2 (two) times daily as needed. 12/05/19   Lucrezia Starch, MD  valACYclovir (VALTREX) 1000 MG tablet TAKE 1 TABLET BY MOUTH DAILY  08/19/16   [provider]      Allergies    Patient has no known allergies.    Review of Systems   Review of Systems  Constitutional:  Negative for fever.  HENT:  Negative for facial swelling.   Respiratory:  Negative for wheezing and stridor.   Gastrointestinal:  Negative for abdominal pain, anal bleeding and vomiting.  All other systems reviewed and are negative.   Physical Exam Updated Vital Signs BP (!) 162/107 (BP Location: Right Arm)   Pulse 75   Temp 98.2 F (36.8 C) (Oral)   Resp 18   Ht 6\' 1"  (1.854 m)   Wt 97.5 kg   SpO2 96%   BMI 28.37 kg/m  Physical Exam Vitals and nursing note reviewed. Exam conducted with a chaperone present Cristie Hem).  Constitutional:      General: He is not in acute distress.    Appearance: He is well-developed. He is not diaphoretic.  HENT:     Head: Normocephalic and atraumatic.     Nose: Nose normal.  Eyes:     Conjunctiva/sclera: Conjunctivae normal.     Pupils: Pupils are equal, round, and reactive to light.  Cardiovascular:     Rate and Rhythm: Normal rate and regular rhythm.  Pulmonary:     Effort: Pulmonary effort is normal.     Breath sounds: Normal breath sounds. No wheezing or rales.  Abdominal:     General: Bowel sounds are normal.  Palpations: Abdomen is soft.     Tenderness: There is no abdominal tenderness. There is no guarding or rebound.  Genitourinary:    Comments: Small non-thrombose hemorrhoid, small abrasion (scabbed linear) near gluteal cleft.   Musculoskeletal:        General: Normal range of motion.     Cervical back: Normal range of motion and neck supple.  Skin:    General: Skin is warm and dry.     Capillary Refill: Capillary refill takes less than 2 seconds.  Neurological:     General: No focal deficit present.     Mental Status: He is alert and oriented to person, place, and time.     ED Results / Procedures / Treatments   Labs (all labs ordered are listed, but only abnormal results  are displayed) Labs Reviewed - No data to display  EKG None  Radiology No results found.  Procedures Procedures    Medications Ordered in ED Medications - No data to display  ED Course/ Medical Decision Making/ A&P                           Medical Decision Making Patient with 2 weeks of rectal itching   Problems Addressed: Hemorrhoids, unspecified hemorrhoid type:    Details: Preparation H and sitz baths   Amount and/or Complexity of Data Reviewed External Data Reviewed: notes.    Details: Previous notes reviewed   Risk Risk Details: Recommend Preparation H and sitz baths.  Increase fiber in the diet to soften stool.  May wish to use baby wipes to soothe the area.  Do not scratch as you have caused an abrasion and abrasions can get infected.  It is not infected at this time.  Follow up with PMD.  Strict return.      Final Clinical Impression(s) / ED Diagnoses Final diagnoses:  Hemorrhoids, unspecified hemorrhoid type   Return for intractable cough, coughing up blood, fevers > 100.4 unrelieved by medication, shortness of breath, intractable vomiting, chest pain, shortness of breath, weakness, numbness, changes in speech, facial asymmetry, abdominal pain, passing out, Inability to tolerate liquids or food, cough, altered mental status or any concerns. No signs of systemic illness or infection. The patient is nontoxic-appearing on exam and vital signs are within normal limits.  I have reviewed the triage vital signs and the nursing notes. Pertinent labs & imaging results that were available during my care of the patient were reviewed by me and considered in my medical decision making (see chart for details). After history, exam, and medical workup I feel the patient has been appropriately medically screened and is safe for discharge home. Pertinent diagnoses were discussed with the patient. Patient was given return precautions.  Rx / DC Orders ED Discharge Orders     None          Aria Jarrard, MD 05/31/22 5631

## 2022-05-31 NOTE — ED Triage Notes (Signed)
Patient arrived via POV c/o rectal itch w/ pain x 2 weeks. Patient denies hemorrhoids, endorses using hydrocortisone cream with mild relief. Patient denies hard stools. Patient is AO x 4, VS w/ elevated BP, normal gait.

## 2022-05-31 NOTE — Discharge Instructions (Signed)
Use preparation H You may wish to change to baby wipe. Try not to scratch the area as it can lead to infection

## 2022-07-18 ENCOUNTER — Encounter (HOSPITAL_BASED_OUTPATIENT_CLINIC_OR_DEPARTMENT_OTHER): Payer: Self-pay | Admitting: Emergency Medicine

## 2022-07-18 ENCOUNTER — Emergency Department (HOSPITAL_BASED_OUTPATIENT_CLINIC_OR_DEPARTMENT_OTHER)
Admission: EM | Admit: 2022-07-18 | Discharge: 2022-07-18 | Disposition: A | Discharge status: Home / Self Care | Payer: BLUE CROSS/BLUE SHIELD | Attending: Emergency Medicine | Admitting: Emergency Medicine

## 2022-07-18 ENCOUNTER — Emergency Department (HOSPITAL_BASED_OUTPATIENT_CLINIC_OR_DEPARTMENT_OTHER): Payer: BLUE CROSS/BLUE SHIELD

## 2022-07-18 ENCOUNTER — Other Ambulatory Visit: Payer: Self-pay

## 2022-07-18 DIAGNOSIS — Z79899 Other long term (current) drug therapy: Secondary | ICD-10-CM | POA: Insufficient documentation

## 2022-07-18 DIAGNOSIS — R1084 Generalized abdominal pain: Secondary | ICD-10-CM | POA: Diagnosis not present

## 2022-07-18 DIAGNOSIS — Z7984 Long term (current) use of oral hypoglycemic drugs: Secondary | ICD-10-CM | POA: Insufficient documentation

## 2022-07-18 DIAGNOSIS — I10 Essential (primary) hypertension: Secondary | ICD-10-CM | POA: Insufficient documentation

## 2022-07-18 DIAGNOSIS — W010XXA Fall on same level from slipping, tripping and stumbling without subsequent striking against object, initial encounter: Secondary | ICD-10-CM | POA: Insufficient documentation

## 2022-07-18 DIAGNOSIS — Z21 Asymptomatic human immunodeficiency virus [HIV] infection status: Secondary | ICD-10-CM | POA: Diagnosis not present

## 2022-07-18 DIAGNOSIS — S63501A Unspecified sprain of right wrist, initial encounter: Secondary | ICD-10-CM | POA: Insufficient documentation

## 2022-07-18 DIAGNOSIS — E119 Type 2 diabetes mellitus without complications: Secondary | ICD-10-CM | POA: Diagnosis not present

## 2022-07-18 DIAGNOSIS — M25531 Pain in right wrist: Secondary | ICD-10-CM | POA: Diagnosis present

## 2022-07-18 LAB — COMPREHENSIVE METABOLIC PANEL
ALT: 21 U/L (ref 0–44)
AST: 22 U/L (ref 15–41)
Albumin: 3.1 g/dL — ABNORMAL LOW (ref 3.5–5.0)
Alkaline Phosphatase: 66 U/L (ref 38–126)
Anion gap: 8 (ref 5–15)
BUN: 9 mg/dL (ref 6–20)
CO2: 24 mmol/L (ref 22–32)
Calcium: 8.1 mg/dL — ABNORMAL LOW (ref 8.9–10.3)
Chloride: 100 mmol/L (ref 98–111)
Creatinine, Ser: 0.85 mg/dL (ref 0.61–1.24)
GFR, Estimated: >60 mL/min (ref >60)
Glucose, Bld: 242 mg/dL — ABNORMAL HIGH (ref 70–99)
Potassium: 4 mmol/L (ref 3.5–5.1)
Sodium: 132 mmol/L — ABNORMAL LOW (ref 135–145)
Total Bilirubin: 0.9 mg/dL (ref 0.3–1.2)
Total Protein: 6.4 g/dL — ABNORMAL LOW (ref 6.5–8.1)

## 2022-07-18 LAB — CBC
HCT: 37.6 % — ABNORMAL LOW (ref 39.0–52.0)
Hemoglobin: 12.5 g/dL — ABNORMAL LOW (ref 13.0–17.0)
MCH: 31.1 pg (ref 26.0–34.0)
MCHC: 33.2 g/dL (ref 30.0–36.0)
MCV: 93.5 fL (ref 80.0–100.0)
Platelets: 286 10*3/uL (ref 150–400)
RBC: 4.02 MIL/uL — ABNORMAL LOW (ref 4.22–5.81)
RDW: 12.8 % (ref 11.5–15.5)
WBC: 7 10*3/uL (ref 4.0–10.5)
nRBC: 0 % (ref 0.0–0.2)

## 2022-07-18 LAB — URINALYSIS, ROUTINE W REFLEX MICROSCOPIC
Bilirubin Urine: NEGATIVE
Glucose, UA: 100 mg/dL — AB
Hgb urine dipstick: NEGATIVE
Ketones, ur: NEGATIVE mg/dL
Leukocytes,Ua: NEGATIVE
Nitrite: NEGATIVE
Protein, ur: NEGATIVE mg/dL
Specific Gravity, Urine: 1.025 (ref 1.005–1.030)
pH: 6 (ref 5.0–8.0)

## 2022-07-18 LAB — LIPASE, BLOOD: Lipase: 32 U/L (ref 11–51)

## 2022-07-18 MED ORDER — FAMOTIDINE 20 MG PO TABS: 20.0000 mg | ORAL_TABLET | Freq: Every day | ORAL | 0 refills | Status: AC

## 2022-07-18 MED ORDER — ACETAMINOPHEN 325 MG PO TABS
650.0000 mg | ORAL_TABLET | Freq: Once | ORAL | Status: AC
  Administered 2022-07-18: 650 mg via ORAL
  Filled 2022-07-18: qty 2

## 2022-07-18 MED ORDER — ALUM & MAG HYDROXIDE-SIMETH 200-200-20 MG/5ML PO SUSP
30.0000 mL | Freq: Once | ORAL | Status: AC
  Administered 2022-07-18: 30 mL via ORAL
  Filled 2022-07-18: qty 30

## 2022-07-18 MED ORDER — FAMOTIDINE 20 MG PO TABS
20.0000 mg | ORAL_TABLET | Freq: Once | ORAL | Status: AC
  Administered 2022-07-18: 20 mg via ORAL
  Filled 2022-07-18: qty 1

## 2022-07-18 NOTE — ED Provider Notes (Signed)
[facility] EMERGENCY DEPARTMENT Provider Note   CSN: [id number] Arrival date & time: 07/18/22  1045     History  Chief Complaint  Patient presents with   Abdominal Pain    Maurice Turner is a 56 y.o. male.  Patient is a 56 year old male with past medical history of HIV on Edwyna Shell, hypertension and diabetes presenting to the emergency department with abdominal pain and right wrist pain.  The patient states that he has been having diffuse abdominal pain for the last several weeks.  He states that its worse across his upper abdomen and gets worse at night.  He states that he does eat a lot of spicy food.  He states he had 1 episode of nausea and vomiting yesterday but has been eating normally since then.  He denies any fevers or chills.  He states he has had some loose stools but denies any black or bloody stools.  He denies any dysuria or hematuria.  Denies any prior abdominal surgeries.  He states that about a month ago he tripped and fell on caught himself with his right hand.  He states that he has been having pain in his right wrist since.  He denies any swelling, numbness or weakness.  He states he takes an occasional Percocet for the pain with some relief.  He states that he is right-handed.  The history is provided by the patient.  Abdominal Pain      Home Medications Prior to Admission medications   Medication Sig Start Date End Date Taking? Authorizing Provider  famotidine (PEPCID) 20 MG tablet Take 1 tablet (20 mg total) by mouth daily. 07/18/22  Yes Theresia Lo, Benetta Spar K, DO  cyclobenzaprine (FLEXERIL) 10 MG tablet One half tab PO qHS, then increase gradually to one tab TID. 08/16/19   Myra Rude, MD  HYDROcodone-acetaminophen (NORCO/VICODIN) 5-325 MG tablet Take 1 tablet by mouth every 8 (eight) hours as needed for moderate pain. 09/26/19   Myra Rude, MD  ibuprofen (ADVIL) 600 MG tablet Take 1 tablet (600 mg total) by mouth every 8 (eight) hours as  needed. 08/16/19   Myra Rude, MD  lisinopril (PRINIVIL,ZESTRIL) 10 MG tablet Take 10 mg by mouth. 12/12/15 05/29/19  [provider]  metFORMIN (GLUCOPHAGE) 500 MG tablet Take 500 mg by mouth. 06/02/16   [provider]  naproxen (NAPROSYN) 500 MG tablet Take 1 tablet (500 mg total) by mouth 2 (two) times daily as needed. 12/05/19   Milagros Loll, MD  valACYclovir (VALTREX) 1000 MG tablet TAKE 1 TABLET BY MOUTH DAILY 08/19/16   [provider]      Allergies    Patient has no known allergies.    Review of Systems   Review of Systems  Gastrointestinal:  Positive for abdominal pain.    Physical Exam Updated Vital Signs BP 139/86   Pulse 65   Temp 98.5 F (36.9 C)   Resp 16   Ht 6\' 2"  (1.88 m)   Wt 97.5 kg   SpO2 98%   BMI 27.60 kg/m  Physical Exam Vitals and nursing note reviewed.  Constitutional:      General: He is not in acute distress.    Appearance: He is well-developed.  HENT:     Head: Normocephalic and atraumatic.     Mouth/Throat:     Mouth: Mucous membranes are moist.     Pharynx: Oropharynx is clear.  Eyes:     Extraocular Movements: Extraocular movements intact.  Cardiovascular:  Rate and Rhythm: Normal rate and regular rhythm.     Heart sounds: Normal heart sounds.     Comments: Bilateral radial pulses 2+ Pulmonary:     Effort: Pulmonary effort is normal.     Breath sounds: Normal breath sounds.  Abdominal:     General: Abdomen is flat.     Palpations: Abdomen is soft.     Tenderness: There is no abdominal tenderness.  Musculoskeletal:     Comments: No bony tenderness to right wrist or hand, no snuffbox tenderness, full wrist flexion/extension.  Full grip strength, finger abduction and okay sign strength intact, sensation intact throughout right wrist and hand  Skin:    General: Skin is warm and dry.  Neurological:     General: No focal deficit present.     Mental Status: He is alert and oriented to person,  place, and time.     Motor: No weakness.  Psychiatric:        Mood and Affect: Mood normal.        Behavior: Behavior normal.     ED Results / Procedures / Treatments   Labs (all labs ordered are listed, but only abnormal results are displayed) Labs Reviewed  COMPREHENSIVE METABOLIC PANEL - Abnormal; Notable for the following components:      Result Value   Sodium 132 (*)    Glucose, Bld 242 (*)    Calcium 8.1 (*)    Total Protein 6.4 (*)    Albumin 3.1 (*)    All other components within normal limits  CBC - Abnormal; Notable for the following components:   RBC 4.02 (*)    Hemoglobin 12.5 (*)    HCT 37.6 (*)    All other components within normal limits  URINALYSIS, ROUTINE W REFLEX MICROSCOPIC - Abnormal; Notable for the following components:   Glucose, UA 100 (*)    All other components within normal limits  LIPASE, BLOOD    EKG None  Radiology DG Wrist Complete Right  Result Date: 07/18/2022 CLINICAL DATA:  Fall 2-3 weeks ago, persistent pain EXAM: RIGHT WRIST - COMPLETE 3+ VIEW COMPARISON:  None Available. FINDINGS: There is no evidence of fracture or dislocation. There is no evidence of arthropathy or other focal bone abnormality. Soft tissues are unremarkable. IMPRESSION: No fracture or dislocation of the right wrist. The carpus is normally aligned. Joint spaces are preserved. Electronically Signed   By: Jearld Lesch M.D.   On: 07/18/2022 13:13    Procedures Procedures    Medications Ordered in ED Medications  alum & mag hydroxide-simeth (MAALOX/MYLANTA) 200-200-20 MG/5ML suspension 30 mL (30 mLs Oral Given 07/18/22 1549)  famotidine (PEPCID) tablet 20 mg (20 mg Oral Given 07/18/22 1549)  acetaminophen (TYLENOL) tablet 650 mg (650 mg Oral Given 07/18/22 1549)    ED Course/ Medical Decision Making/ A&P                           Medical Decision Making This patient presents to the ED with chief complaint(s) of abdominal pain, wrist pain with pertinent past  medical history of HIV on HAART, HTN, DM which further complicates the presenting complaint. The complaint involves an extensive differential diagnosis and also carries with it a high risk of complications and morbidity.    The differential diagnosis includes gastritis, GERD, pancreatitis, hepatitis, cholelithiasis, cholecystitis, gastroenteritis, wrist fracture, wrist sprain  Additional history obtained: Additional history obtained from N/A Records reviewed ID records  ED Course  and Reassessment: On patient's arrival he is awake alert and well-appearing in no acute distress.  He had abdominal labs performed that were within normal range as well as a right wrist x-ray that showed no signs of fracture.  His wrist is neurovascularly intact with no point tenderness and no snuffbox tenderness.  He likely has a wrist sprain and will be given an Ace wrap for support.  Considering possible gastritis or GERD as cause of his abdominal pain and he will be given GI cocktail.  He was recommended to take antacid for the next 2 weeks and to avoid NSAIDs and to follow-up with his primary doctor.  He was given strict return precautions.  Independent labs interpretation:  The following labs were independently interpreted: within normal range  Independent visualization of imaging: - I independently visualized the following imaging with scope of interpretation limited to determining acute life threatening conditions related to emergency care: R wrist x-ray, which revealed no acute disease  Consultation: - Consulted or discussed management/test interpretation w/ external professional: N/A  Consideration for admission or further workup: Patient has no emergent conditions requiring admission or further work-up at this time and is stable for discharge home with primary care follow-up Social Determinants of health: N/A    Amount and/or Complexity of Data Reviewed Labs: ordered. Radiology: ordered.  Risk OTC  drugs.          Final Clinical Impression(s) / ED Diagnoses Final diagnoses:  Generalized abdominal pain  Sprain of right wrist, initial encounter    Rx / DC Orders ED Discharge Orders          Ordered    famotidine (PEPCID) 20 MG tablet  Daily        07/18/22 1549              Rexford Maus, DO 07/18/22 1551

## 2022-07-18 NOTE — Discharge Instructions (Addendum)
You were seen in the emergency department for your abdominal pain and your wrist pain.  You had no broken bones on your x-ray likely sprained your wrist.  Your labs here were normal.  You may have some inflammation of your stomach or acid reflux and I have given you an antacid medicine that you should take every day for the next 2 weeks to help with your pain.  You should avoid NSAIDs like Motrin, Advil or Aleve.  You should follow-up with your primary doctor to have your symptoms rechecked.  You should return to the emergency department if your pain gets significantly worse, you have fevers, you have repetitive vomiting or if you have any other new or concerning symptoms.

## 2022-07-18 NOTE — ED Triage Notes (Addendum)
Patient c/o abdominal pain for past couple of weeks. Patient had one episode of vomiting yesterday. Patient had a fall 2-3 weeks ago causing injury to right wrist and would like evaluation for it.

## 2022-07-18 NOTE — ED Notes (Signed)
Pt reports improvement in symptoms with po medications

## 2022-08-04 ENCOUNTER — Ambulatory Visit: Payer: Self-pay

## 2022-08-04 ENCOUNTER — Encounter: Payer: Self-pay | Admitting: Family Medicine

## 2022-08-04 ENCOUNTER — Ambulatory Visit (INDEPENDENT_AMBULATORY_CARE_PROVIDER_SITE_OTHER): Payer: BLUE CROSS/BLUE SHIELD | Admitting: Family Medicine

## 2022-08-04 VITALS — BP 120/84 | Ht 73.0 in | Wt 215.0 lb

## 2022-08-04 DIAGNOSIS — S63399A Traumatic rupture of other ligament of unspecified wrist, initial encounter: Secondary | ICD-10-CM | POA: Diagnosis not present

## 2022-08-04 DIAGNOSIS — M25531 Pain in right wrist: Secondary | ICD-10-CM

## 2022-08-04 MED ORDER — METHYLPREDNISOLONE ACETATE 40 MG/ML IJ SUSP
40.0000 mg | Freq: Once | INTRAMUSCULAR | Status: AC
  Administered 2022-08-04: 40 mg via INTRAMUSCULAR

## 2022-08-04 NOTE — Progress Notes (Unsigned)
  Maurice Turner - 56 y.o. male MRN [id number]  Date of birth: Apr 30, 1966  SUBJECTIVE:  Including CC & ROS.  No chief complaint on file.   Maurice Turner is a 56 y.o. male that is  presenting with right wrist pain after an injury. Initial injury occurred two months ago. Now having limitation in his wrist and pain with pushing off.  Reviewed the emergency department note from 11/5 shows he was counseled on supportive care. Independent review of the right wrist x-ray from 11/25 shows no acute changes.  Review of Systems See HPI   HISTORY: Past Medical, Surgical, Social, and Family History Reviewed & Updated per EMR.   Pertinent Historical Findings include:  Past Medical History:  Diagnosis Date   Chronic right-sided low back pain with right-sided sciatica    Diabetes mellitus without complication (HCC)    Herpes    HIV (human immunodeficiency virus infection) (HCC)    Hypertension     History reviewed. No pertinent surgical history.   PHYSICAL EXAM:  VS: BP 120/84   Ht 6\' 1"  (1.854 m)   Wt 215 lb (97.5 kg)   BMI 28.37 kg/m  Physical Exam Gen: NAD, alert, cooperative with exam, well-appearing MSK:  Right wrist:  Tenderness to palpation over the scapholunate ligament. Positive clunk test of the wrist. Limited extension and flexion of the wrist. Neurovascularly intact    Limited ultrasound: Right wrist pain:  Effusion noticed within the carpal joints. No changes of the distal radius. Normal-appearing scaphoid bone. There appears to be complete disruption of the scapholunate ligament. Normal-appearing CMC joint  Summary: Findings consistent with complete rupture of scapholunate ligament  Ultrasound and interpretation by , MD    ASSESSMENT & PLAN:   Rupture of scapholunate ligament Acutely occurring.  Initial injury was 2 months ago.  Now having mechanical problems of the wrist.  There is no findings on x-ray.  Ultrasound consistent with complete  disruption of the scapholunate ligament. -Counseled on home exercise therapy and supportive care. -Counseled on bracing. -IM Depo-Medrol. -MR arthrogram of the right wrist to evaluate for scapholunate ligament and for presurgical planning.

## 2022-08-04 NOTE — Patient Instructions (Signed)
Good to see you Please use the brace  Please use ice as needed  We'll send you to [facility] for the MRI   Please send me a message in MyChart with any questions or updates.  We'll setup a virtual visit once the MRi is resulted.   --Dr. Jordan Likes

## 2022-08-04 NOTE — Assessment & Plan Note (Signed)
Acutely occurring.  Initial injury was 2 months ago.  Now having mechanical problems of the wrist.  There is no findings on x-ray.  Ultrasound consistent with complete disruption of the scapholunate ligament. -Counseled on home exercise therapy and supportive care. -Counseled on bracing. -IM Depo-Medrol. -MR arthrogram of the right wrist to evaluate for scapholunate ligament and for presurgical planning.

## 2022-08-21 NOTE — Addendum Note (Signed)
Addended by: Merrilyn Puma on: 08/21/2022 10:37 AM   Modules accepted: Orders

## 2022-12-08 ENCOUNTER — Encounter: Payer: Self-pay | Admitting: *Deleted

## 2022-12-16 ENCOUNTER — Emergency Department (HOSPITAL_BASED_OUTPATIENT_CLINIC_OR_DEPARTMENT_OTHER)
Admission: EM | Admit: 2022-12-16 | Discharge: 2022-12-17 | Disposition: A | Discharge status: Home / Self Care | Payer: BLUE CROSS/BLUE SHIELD | Attending: Emergency Medicine | Admitting: Emergency Medicine

## 2022-12-16 ENCOUNTER — Encounter (HOSPITAL_BASED_OUTPATIENT_CLINIC_OR_DEPARTMENT_OTHER): Payer: Self-pay

## 2022-12-16 DIAGNOSIS — Z87891 Personal history of nicotine dependence: Secondary | ICD-10-CM | POA: Diagnosis not present

## 2022-12-16 DIAGNOSIS — Z7984 Long term (current) use of oral hypoglycemic drugs: Secondary | ICD-10-CM | POA: Diagnosis not present

## 2022-12-16 DIAGNOSIS — L03011 Cellulitis of right finger: Secondary | ICD-10-CM | POA: Diagnosis not present

## 2022-12-16 DIAGNOSIS — Z79899 Other long term (current) drug therapy: Secondary | ICD-10-CM | POA: Diagnosis not present

## 2022-12-16 DIAGNOSIS — E1165 Type 2 diabetes mellitus with hyperglycemia: Secondary | ICD-10-CM | POA: Insufficient documentation

## 2022-12-16 DIAGNOSIS — R739 Hyperglycemia, unspecified: Secondary | ICD-10-CM

## 2022-12-16 DIAGNOSIS — I10 Essential (primary) hypertension: Secondary | ICD-10-CM | POA: Diagnosis not present

## 2022-12-16 HISTORY — DX: Type 2 diabetes mellitus without complications: E11.9

## 2022-12-16 LAB — CBG MONITORING, ED: Glucose-Capillary: 452 mg/dL — ABNORMAL HIGH (ref 70–99)

## 2022-12-16 NOTE — ED Notes (Signed)
Patient states he has been urinating today infinitely more than normal.

## 2022-12-16 NOTE — ED Triage Notes (Signed)
Pt c/o right second finger pain and redness. Pt reports the cuticle is red, inflamed and painful.

## 2022-12-16 NOTE — ED Notes (Signed)
2 Lt Chilton Si, Lav and [employer] in the lab. RRT also has possession of dark green for VBG if needed.

## 2022-12-17 ENCOUNTER — Encounter (HOSPITAL_BASED_OUTPATIENT_CLINIC_OR_DEPARTMENT_OTHER): Payer: Self-pay | Admitting: Emergency Medicine

## 2022-12-17 ENCOUNTER — Other Ambulatory Visit: Payer: Self-pay

## 2022-12-17 DIAGNOSIS — L03011 Cellulitis of right finger: Secondary | ICD-10-CM | POA: Diagnosis not present

## 2022-12-17 LAB — CBG MONITORING, ED
Glucose-Capillary: 298 mg/dL — ABNORMAL HIGH (ref 70–99)
Glucose-Capillary: 251 mg/dL — ABNORMAL HIGH (ref 70–99)

## 2022-12-17 LAB — BASIC METABOLIC PANEL
Anion gap: 12 (ref 5–15)
BUN: 14 mg/dL (ref 6–20)
CO2: 19 mmol/L — ABNORMAL LOW (ref 22–32)
Calcium: 8.4 mg/dL — ABNORMAL LOW (ref 8.9–10.3)
Chloride: 100 mmol/L (ref 98–111)
Creatinine, Ser: 1 mg/dL (ref 0.61–1.24)
GFR, Estimated: >60 mL/min (ref >60)
Glucose, Bld: 444 mg/dL — ABNORMAL HIGH (ref 70–99)
Potassium: 3.8 mmol/L (ref 3.5–5.1)
Sodium: 131 mmol/L — ABNORMAL LOW (ref 135–145)

## 2022-12-17 LAB — URINALYSIS, ROUTINE W REFLEX MICROSCOPIC
Bilirubin Urine: NEGATIVE
Glucose, UA: >=500 mg/dL — AB
Hgb urine dipstick: NEGATIVE
Ketones, ur: NEGATIVE mg/dL
Leukocytes,Ua: NEGATIVE
Nitrite: NEGATIVE
Protein, ur: NEGATIVE mg/dL
Specific Gravity, Urine: 1.015 (ref 1.005–1.030)
pH: 5.5 (ref 5.0–8.0)

## 2022-12-17 LAB — CBC WITH DIFFERENTIAL/PLATELET
Abs Immature Granulocytes: 0.04 10*3/uL (ref 0.00–0.07)
Basophils Absolute: 0 10*3/uL (ref 0.0–0.1)
Basophils Relative: 0 %
Eosinophils Absolute: 0 10*3/uL (ref 0.0–0.5)
Eosinophils Relative: 0 %
HCT: 38 % — ABNORMAL LOW (ref 39.0–52.0)
Hemoglobin: 13.6 g/dL (ref 13.0–17.0)
Immature Granulocytes: 1 %
Lymphocytes Relative: 44 %
Lymphs Abs: 3.8 10*3/uL (ref 0.7–4.0)
MCH: 32.5 pg (ref 26.0–34.0)
MCHC: 35.8 g/dL (ref 30.0–36.0)
MCV: 90.9 fL (ref 80.0–100.0)
Monocytes Absolute: 0.6 10*3/uL (ref 0.1–1.0)
Monocytes Relative: 7 %
Neutro Abs: 4.2 10*3/uL (ref 1.7–7.7)
Neutrophils Relative %: 48 %
Platelets: 184 10*3/uL (ref 150–400)
RBC: 4.18 MIL/uL — ABNORMAL LOW (ref 4.22–5.81)
RDW: 12.1 % (ref 11.5–15.5)
WBC: 8.7 10*3/uL (ref 4.0–10.5)
nRBC: 0 % (ref 0.0–0.2)

## 2022-12-17 LAB — URINALYSIS, MICROSCOPIC (REFLEX)

## 2022-12-17 MED ORDER — LACTATED RINGERS IV BOLUS
20.0000 mL/kg | Freq: Once | INTRAVENOUS | Status: AC
  Administered 2022-12-17: 1950 mL via INTRAVENOUS

## 2022-12-17 MED ORDER — DEXTROSE IN LACTATED RINGERS 5 % IV SOLN: INTRAVENOUS | Status: DC

## 2022-12-17 MED ORDER — HYDROCODONE-ACETAMINOPHEN 5-325 MG PO TABS
1.0000 | ORAL_TABLET | Freq: Once | ORAL | Status: AC
  Administered 2022-12-17: 1 via ORAL
  Filled 2022-12-17: qty 1

## 2022-12-17 MED ORDER — INSULIN ASPART 100 UNIT/ML IJ SOLN
10.0000 [IU] | Freq: Once | INTRAMUSCULAR | Status: AC
  Administered 2022-12-17: 10 [IU] via SUBCUTANEOUS

## 2022-12-17 MED ORDER — LIDOCAINE HCL 2 % IJ SOLN
10.0000 mL | Freq: Once | INTRAMUSCULAR | Status: AC
  Administered 2022-12-17: 200 mg
  Filled 2022-12-17: qty 40

## 2022-12-17 MED ORDER — LACTATED RINGERS IV SOLN: INTRAVENOUS | Status: DC

## 2022-12-17 MED ORDER — GLIPIZIDE 5 MG PO TABS: 5.0000 mg | ORAL_TABLET | Freq: Every day | ORAL | 0 refills | Status: AC

## 2022-12-17 MED ORDER — INSULIN REGULAR(HUMAN) IN NACL 100-0.9 UT/100ML-% IV SOLN: INTRAVENOUS | Status: DC

## 2022-12-17 MED ORDER — SODIUM CHLORIDE 0.9 % IV BOLUS
1000.0000 mL | Freq: Once | INTRAVENOUS | Status: AC
  Administered 2022-12-17: 1000 mL via INTRAVENOUS

## 2022-12-17 MED ORDER — DEXTROSE 50 % IV SOLN: 0.0000 mL | INTRAVENOUS | Status: DC | PRN

## 2022-12-17 NOTE — ED Provider Notes (Signed)
[facility] Provider Note: Lowella Dell, MD, FACEP  CSN: [id number] MRN: [id number] ARRIVAL: 12/16/22 at 2311 ROOM: MH01/[facility]01   CHIEF COMPLAINT  Hand Pain   HISTORY OF PRESENT ILLNESS  12/17/22 12:15 AM Maurice Turner is a 57 y.o. male with tenderness and swelling on the hyperthenar side of his right index finger.  He admits to biting his nails.  He rates associated pain as a 10 out of 10, worse with palpation.  He also is a diabetic and states she has been urinating frequently lately.  His blood sugar was found to be 452.  He states he has been compliant with his metformin.   Past Medical History:  Diagnosis Date   Chronic right-sided low back pain with right-sided sciatica    DM (diabetes mellitus)    Herpes    HIV (human immunodeficiency virus infection)    Hypertension     History reviewed. No pertinent surgical history.  Family History  Problem Relation Age of Onset   Hypertension Mother    Hypertension Father    Hypertension Sister    Hypertension Brother     Social History   Tobacco Use   Smoking status: Former    Types: Cigars   Smokeless tobacco: Never  Building services engineer Use: Every day  Substance Use Topics   Alcohol use: Yes    Comment: occ   Drug use: Yes    Types: Marijuana    Prior to Admission medications   Medication Sig Start Date End Date Taking? Authorizing Provider  emtricitabine-rilpivir-tenofovir AF (ODEFSEY) 200-25-25 MG TABS tablet Take 1 tablet by mouth daily. 11/26/22  Yes [provider]  glipiZIDE (GLUCOTROL) 5 MG tablet Take 1 tablet (5 mg total) by mouth daily before breakfast. 12/17/22  Yes Lataysha Vohra, MD  cyclobenzaprine (FLEXERIL) 10 MG tablet One half tab PO qHS, then increase gradually to one tab TID. 08/16/19   Myra Rude, MD  famotidine (PEPCID) 20 MG tablet Take 1 tablet (20 mg total) by mouth daily. 07/18/22   Rexford Maus, DO  HYDROcodone-acetaminophen (NORCO/VICODIN) 5-325 MG  tablet Take 1 tablet by mouth every 8 (eight) hours as needed for moderate pain. 09/26/19   Myra Rude, MD  lisinopril (PRINIVIL,ZESTRIL) 10 MG tablet Take 10 mg by mouth. 12/12/15 05/29/19  [provider]  metFORMIN (GLUCOPHAGE) 500 MG tablet Take 500 mg by mouth. 06/02/16   [provider]  valACYclovir (VALTREX) 1000 MG tablet TAKE 1 TABLET BY MOUTH DAILY 08/19/16   [provider]    Allergies Patient has no known allergies.   REVIEW OF SYSTEMS  Negative except as noted here or in the History of Present Illness.   PHYSICAL EXAMINATION  Initial Vital Signs Blood pressure (!) 162/102, pulse 95, temperature 97.9 F (36.6 C), resp. rate 18, height  (1.854 m), weight 97.5 kg, SpO2 96 %.  Examination General: Well-developed, well-nourished male in no acute distress; appearance consistent with age of record HENT: normocephalic; atraumatic Eyes: Normal appearance Neck: supple Heart: regular rate and rhythm Lungs: clear to auscultation bilaterally Abdomen: soft; nondistended; nontender; bowel sounds present Extremities: No deformity; full range of motion; pulses normal; swelling and tenderness on the hyperthenar side of the right index fingernail, no felon Neurologic: Awake, alert and oriented; motor function intact in all extremities and symmetric; no facial droop Skin: Warm and dry Psychiatric: Normal mood and affect   RESULTS  Summary of this visit's results, reviewed and interpreted by myself:  EKG Interpretation  Date/Time:    Ventricular Rate:    PR Interval:    QRS Duration:   QT Interval:    QTC Calculation:   R Axis:     Text Interpretation:         Laboratory Studies: Results for orders placed or performed during the hospital encounter of 12/16/22 (from the past 24 hour(s))  CBG monitoring, ED     Status: Abnormal   Collection Time: 12/16/22 11:27 PM  Result Value Ref Range   Glucose-Capillary 452 (H) 70 - 99 mg/dL    Comment 1 Notify RN   Basic metabolic panel     Status: Abnormal   Collection Time: 12/17/22 12:34 AM  Result Value Ref Range   Sodium 131 (L) 135 - 145 mmol/L   Potassium 3.8 3.5 - 5.1 mmol/L   Chloride 100 98 - 111 mmol/L   CO2 19 (L) 22 - 32 mmol/L   Glucose, Bld 444 (H) 70 - 99 mg/dL   BUN 14 6 - 20 mg/dL   Creatinine, Ser 1.61 0.61 - 1.24 mg/dL   Calcium 8.4 (L) 8.9 - 10.3 mg/dL   GFR, Estimated >09 >60 mL/min   Anion gap 12 5 - 15  CBC with Differential (PNL)     Status: Abnormal   Collection Time: 12/17/22 12:34 AM  Result Value Ref Range   WBC 8.7 4.0 - 10.5 K/uL   RBC 4.18 (L) 4.22 - 5.81 MIL/uL   Hemoglobin 13.6 13.0 - 17.0 g/dL   HCT 45.4 (L) 09.8 - 11.9 %   MCV 90.9 80.0 - 100.0 fL   MCH 32.5 26.0 - 34.0 pg   MCHC 35.8 30.0 - 36.0 g/dL   RDW 14.7 82.9 - 56.2 %   Platelets 184 150 - 400 K/uL   nRBC 0.0 0.0 - 0.2 %   Neutrophils Relative % 48 %   Neutro Abs 4.2 1.7 - 7.7 K/uL   Lymphocytes Relative 44 %   Lymphs Abs 3.8 0.7 - 4.0 K/uL   Monocytes Relative 7 %   Monocytes Absolute 0.6 0.1 - 1.0 K/uL   Eosinophils Relative 0 %   Eosinophils Absolute 0.0 0.0 - 0.5 K/uL   Basophils Relative 0 %   Basophils Absolute 0.0 0.0 - 0.1 K/uL   Immature Granulocytes 1 %   Abs Immature Granulocytes 0.04 0.00 - 0.07 K/uL  Urinalysis, Routine w reflex microscopic -Urine, Clean Catch     Status: Abnormal   Collection Time: 12/17/22  1:38 AM  Result Value Ref Range   Color, Urine YELLOW YELLOW   APPearance CLEAR CLEAR   Specific Gravity, Urine 1.015 1.005 - 1.030   pH 5.5 5.0 - 8.0   Glucose, UA >=500 (A) NEGATIVE mg/dL   Hgb urine dipstick NEGATIVE NEGATIVE   Bilirubin Urine NEGATIVE NEGATIVE   Ketones, ur NEGATIVE NEGATIVE mg/dL   Protein, ur NEGATIVE NEGATIVE mg/dL   Nitrite NEGATIVE NEGATIVE   Leukocytes,Ua NEGATIVE NEGATIVE  Urinalysis, Microscopic (reflex)     Status: Abnormal   Collection Time: 12/17/22  1:38 AM  Result Value Ref Range   RBC / HPF 0-5 0 - 5  RBC/hpf   WBC, UA 0-5 0 - 5 WBC/hpf   Bacteria, UA RARE (A) NONE SEEN   Squamous Epithelial / HPF 0-5 0 - 5 /HPF  CBG monitoring, ED     Status: Abnormal   Collection Time: 12/17/22  1:47 AM  Result Value Ref Range   Glucose-Capillary 298 (H) 70 -  99 mg/dL  CBG monitoring, ED     Status: Abnormal   Collection Time: 12/17/22  3:18 AM  Result Value Ref Range   Glucose-Capillary 251 (H) 70 - 99 mg/dL   Imaging Studies: No results found.  ED COURSE and MDM  Nursing notes, initial and subsequent vitals signs, including pulse oximetry, reviewed and interpreted by myself.  Vitals:   12/16/22 2323 12/16/22 2324 12/17/22 0320  BP:  (!) 162/102 (!) 148/91  Pulse:  95 94  Resp:  18 18  Temp:  97.9 F (36.6 C) 98.2 F (36.8 C)  TempSrc:   Oral  SpO2:  96% 99%  Weight: 97.5 kg    Height:  (1.854 m)     Medications  lidocaine (XYLOCAINE) 2 % (with pres) injection 200 mg (200 mg Infiltration Given by Other 12/17/22 0045)  lactated ringers bolus 1,950 mL (0 mLs Intravenous Stopped 12/17/22 0253)  HYDROcodone-acetaminophen (NORCO/VICODIN) 5-325 MG per tablet 1 tablet (1 tablet Oral Given 12/17/22 0117)  insulin aspart (novoLOG) injection 10 Units (10 Units Subcutaneous Given 12/17/22 0211)  sodium chloride 0.9 % bolus 1,000 mL (0 mLs Intravenous Stopped 12/17/22 0322)   12:38 AM I&D performed on paronychia.  Hyperglycemia protocol initiated.  1:52 AM Sugar down to 298 after initial bolus.  Will give additional liter of fluid and subcu insulin.  3:21 AM Patient sugar down to 251.  I suspect the patient's diabetes has become resistant to Glucophage as monotherapy.  Will start him on glipizide and have him follow-up with his endocrinologist.   PROCEDURES  Procedures INCISION AND DRAINAGE Performed by: Carlisle Beers Larwence Tu Consent: Verbal consent obtained. Risks and benefits: risks, benefits and alternatives were discussed Type: Paronychia  Body area: Right index finger  Anesthesia:  Hemi digital block  Incision was made with a scalpel.  Local anesthetic: lidocaine 2% without epinephrine  Anesthetic total: 3 ml  Complexity: complex Blunt dissection to break up loculations  Drainage: purulent  Drainage amount: Copious  Packing material: None  Patient tolerance: Patient tolerated the procedure well with no immediate complications.  ED DIAGNOSES     ICD-10-CM   1. Paronychia of finger of right hand  L03.011     2. Hyperglycemia  R73.9          Tabius Rood, Jonny Ruiz, MD 12/17/22 [phone]

## 2023-01-04 ENCOUNTER — Encounter (HOSPITAL_BASED_OUTPATIENT_CLINIC_OR_DEPARTMENT_OTHER): Payer: Self-pay | Admitting: Urology

## 2023-01-04 ENCOUNTER — Emergency Department (HOSPITAL_BASED_OUTPATIENT_CLINIC_OR_DEPARTMENT_OTHER)
Admission: EM | Admit: 2023-01-04 | Discharge: 2023-01-04 | Disposition: A | Discharge status: Home / Self Care | Payer: BLUE CROSS/BLUE SHIELD | Attending: Emergency Medicine | Admitting: Emergency Medicine

## 2023-01-04 DIAGNOSIS — R739 Hyperglycemia, unspecified: Secondary | ICD-10-CM

## 2023-01-04 DIAGNOSIS — E1165 Type 2 diabetes mellitus with hyperglycemia: Secondary | ICD-10-CM | POA: Insufficient documentation

## 2023-01-04 LAB — BASIC METABOLIC PANEL
Anion gap: 10 (ref 5–15)
BUN: 20 mg/dL (ref 6–20)
CO2: 22 mmol/L (ref 22–32)
Calcium: 8.7 mg/dL — ABNORMAL LOW (ref 8.9–10.3)
Chloride: 99 mmol/L (ref 98–111)
Creatinine, Ser: 1.11 mg/dL (ref 0.61–1.24)
GFR, Estimated: >60 mL/min (ref >60)
Glucose, Bld: 414 mg/dL — ABNORMAL HIGH (ref 70–99)
Potassium: 3.9 mmol/L (ref 3.5–5.1)
Sodium: 131 mmol/L — ABNORMAL LOW (ref 135–145)

## 2023-01-04 LAB — CBC WITH DIFFERENTIAL/PLATELET
Abs Immature Granulocytes: 0.07 10*3/uL (ref 0.00–0.07)
Basophils Absolute: 0 10*3/uL (ref 0.0–0.1)
Basophils Relative: 0 %
Eosinophils Absolute: 0 10*3/uL (ref 0.0–0.5)
Eosinophils Relative: 0 %
HCT: 36 % — ABNORMAL LOW (ref 39.0–52.0)
Hemoglobin: 12.7 g/dL — ABNORMAL LOW (ref 13.0–17.0)
Immature Granulocytes: 1 %
Lymphocytes Relative: 35 %
Lymphs Abs: 3.6 10*3/uL (ref 0.7–4.0)
MCH: 33.3 pg (ref 26.0–34.0)
MCHC: 35.3 g/dL (ref 30.0–36.0)
MCV: 94.5 fL (ref 80.0–100.0)
Monocytes Absolute: 0.8 10*3/uL (ref 0.1–1.0)
Monocytes Relative: 8 %
Neutro Abs: 5.9 10*3/uL (ref 1.7–7.7)
Neutrophils Relative %: 56 %
Platelets: 220 10*3/uL (ref 150–400)
RBC: 3.81 MIL/uL — ABNORMAL LOW (ref 4.22–5.81)
RDW: 13.4 % (ref 11.5–15.5)
WBC: 10.5 10*3/uL (ref 4.0–10.5)
nRBC: 0 % (ref 0.0–0.2)

## 2023-01-04 LAB — CBG MONITORING, ED
Glucose-Capillary: 402 mg/dL — ABNORMAL HIGH (ref 70–99)
Glucose-Capillary: 365 mg/dL — ABNORMAL HIGH (ref 70–99)
Glucose-Capillary: 285 mg/dL — ABNORMAL HIGH (ref 70–99)

## 2023-01-04 MED ORDER — INSULIN ASPART 100 UNIT/ML IV SOLN
15.0000 [IU] | Freq: Once | INTRAVENOUS | Status: AC
  Administered 2023-01-04: 15 [IU] via INTRAVENOUS

## 2023-01-04 MED ORDER — SODIUM CHLORIDE 0.9 % IV BOLUS
1000.0000 mL | Freq: Once | INTRAVENOUS | Status: AC
  Administered 2023-01-04: 1000 mL via INTRAVENOUS

## 2023-01-04 NOTE — ED Notes (Signed)
Pt also reports that after noticing BS down to 380 this am he ate a banana and some crackers, pt has knowledge deficit on diabetic diet

## 2023-01-04 NOTE — ED Triage Notes (Signed)
Pt states BS >400 x 2 weeks   Takes metformin and glipizide, took them both this am  Reports loss of appetite and nausea yesterday, none now  Ate watermelon and grits this am

## 2023-01-04 NOTE — ED Provider Notes (Signed)
[place] [facility] Provider Note   CSN: [id number] Arrival date & time: 01/04/23  1135     History  Chief Complaint  Patient presents with   Hyperglycemia    Maurice Turner is a 57 y.o. male.  57 yo M with chief complaint high blood sugar.  This been going on for at least a couple weeks.  He feels like he is been taking his medicines as he should.  He had 1 episode where he felt like he did not feel well 8 some food and then checked his sugar and then noted it was high.  No chest pain no abdominal pain no nausea or vomiting.  No fevers.   Hyperglycemia      Home Medications Prior to Admission medications   Medication Sig Start Date End Date Taking? Authorizing Provider  cyclobenzaprine (FLEXERIL) 10 MG tablet One half tab PO qHS, then increase gradually to one tab TID. 08/16/19   Myra Rude, MD  emtricitabine-rilpivir-tenofovir AF (ODEFSEY) 200-25-25 MG TABS tablet Take 1 tablet by mouth daily. 11/26/22   [provider]  famotidine (PEPCID) 20 MG tablet Take 1 tablet (20 mg total) by mouth daily. 07/18/22   Elayne Snare K, DO  glipiZIDE (GLUCOTROL) 5 MG tablet Take 1 tablet (5 mg total) by mouth daily before breakfast. 12/17/22   Molpus, John, MD  HYDROcodone-acetaminophen (NORCO/VICODIN) 5-325 MG tablet Take 1 tablet by mouth every 8 (eight) hours as needed for moderate pain. 09/26/19   Myra Rude, MD  lisinopril (PRINIVIL,ZESTRIL) 10 MG tablet Take 10 mg by mouth. 12/12/15 05/29/19  [provider]  metFORMIN (GLUCOPHAGE) 500 MG tablet Take 500 mg by mouth. 06/02/16   [provider]  valACYclovir (VALTREX) 1000 MG tablet TAKE 1 TABLET BY MOUTH DAILY 08/19/16   [provider]      Allergies    Patient has no known allergies.    Review of Systems   Review of Systems  Physical Exam Updated Vital Signs BP (!) 118/90 (BP Location: Left Arm)   Pulse 86   Temp 98.2 F (36.8 C)    Resp 18   Ht 6\' 1"  (1.854 m)   Wt 97.5 kg   SpO2 95%   BMI 28.36 kg/m  Physical Exam Vitals and nursing note reviewed.  Constitutional:      Appearance: He is well-developed.  HENT:     Head: Normocephalic and atraumatic.  Eyes:     Pupils: Pupils are equal, round, and reactive to light.  Neck:     Vascular: No JVD.  Cardiovascular:     Rate and Rhythm: Normal rate and regular rhythm.     Heart sounds: No murmur heard.    No friction rub. No gallop.  Pulmonary:     Effort: No respiratory distress.     Breath sounds: No wheezing.  Abdominal:     General: There is no distension.     Tenderness: There is no abdominal tenderness. There is no guarding or rebound.  Musculoskeletal:        General: Normal range of motion.     Cervical back: Normal range of motion and neck supple.  Skin:    Coloration: Skin is not pale.     Findings: No rash.  Neurological:     Mental Status: He is alert and oriented to person, place, and time.  Psychiatric:        Behavior: Behavior normal.     ED Results /  Procedures / Treatments   Labs (all labs ordered are listed, but only abnormal results are displayed) Labs Reviewed  CBC WITH DIFFERENTIAL/PLATELET - Abnormal; Notable for the following components:      Result Value   RBC 3.81 (*)    Hemoglobin 12.7 (*)    HCT 36.0 (*)    All other components within normal limits  BASIC METABOLIC PANEL - Abnormal; Notable for the following components:   Sodium 131 (*)    Glucose, Bld 414 (*)    Calcium 8.7 (*)    All other components within normal limits  CBG MONITORING, ED - Abnormal; Notable for the following components:   Glucose-Capillary 402 (*)    All other components within normal limits  CBG MONITORING, ED - Abnormal; Notable for the following components:   Glucose-Capillary 365 (*)    All other components within normal limits    EKG None  Radiology No results found.  Procedures Procedures    Medications Ordered in  ED Medications  sodium chloride 0.9 % bolus 1,000 mL (0 mLs Intravenous Stopped 01/04/23 1334)  insulin aspart (novoLOG) injection 15 Units (15 Units Intravenous Given 01/04/23 1335)    ED Course/ Medical Decision Making/ A&P                             Medical Decision Making Amount and/or Complexity of Data Reviewed Labs: ordered.  Risk OTC drugs.   57 yo M with a history of diabetes comes in with a chief complaints of high blood sugar.  By history it sounds like the patient has been eating very high carbohydrate meals.  Perhaps this is the cause of his persistent hypoglycemia.  This has come down a bit after IV fluids, lab work not consistent with diabetic ketoacidosis.  His bicarb is normal, no anion gap.  No significant anemia.  Will give a bolus dose of insulin here.  Will have him follow-up with his family doctor in the office.  1:40 PM:  I have discussed the diagnosis/risks/treatment options with the patient.  Evaluation and diagnostic testing in the emergency department does not suggest an emergent condition requiring admission or immediate intervention beyond what has been performed at this time.  They will follow up with PCP. We also discussed returning to the ED immediately if new or worsening sx occur. We discussed the sx which are most concerning (e.g., sudden worsening pain, fever, inability to tolerate by mouth) that necessitate immediate return. Medications administered to the patient during their visit and any new prescriptions provided to the patient are listed below.  Medications given during this visit Medications  sodium chloride 0.9 % bolus 1,000 mL (0 mLs Intravenous Stopped 01/04/23 1334)  insulin aspart (novoLOG) injection 15 Units (15 Units Intravenous Given 01/04/23 1335)     The patient appears reasonably screen and/or stabilized for discharge and I doubt any other medical condition or other [facility] requiring further screening, evaluation, or treatment in the ED at  this time prior to discharge.          Final Clinical Impression(s) / ED Diagnoses Final diagnoses:  Hyperglycemia    Rx / DC Orders ED Discharge Orders     None         Melene Plan, DO 01/04/23 1340

## 2023-01-04 NOTE — ED Notes (Signed)
Pt reports blood sugar being above 400 for the last two weeks. Pt states the only symptoms he has noticed is loss of appetite. Pt is resting, no signs of acute distress and vitals WDL.

## 2023-01-04 NOTE — Discharge Instructions (Addendum)
Please follow-up with your family doctor in the office.  They may need to change your medication regiment.  I think you also might benefit from being seen by a nutritionist.  Please mention this to the PCP and see if they can set it up for you.

## 2023-10-17 ENCOUNTER — Encounter (HOSPITAL_BASED_OUTPATIENT_CLINIC_OR_DEPARTMENT_OTHER): Payer: Self-pay

## 2023-10-17 ENCOUNTER — Other Ambulatory Visit: Payer: Self-pay

## 2023-10-17 ENCOUNTER — Emergency Department (HOSPITAL_BASED_OUTPATIENT_CLINIC_OR_DEPARTMENT_OTHER)
Admission: EM | Admit: 2023-10-17 | Discharge: 2023-10-17 | Disposition: A | Discharge status: Home / Self Care | Payer: BLUE CROSS/BLUE SHIELD | Attending: Emergency Medicine | Admitting: Emergency Medicine

## 2023-10-17 DIAGNOSIS — R739 Hyperglycemia, unspecified: Secondary | ICD-10-CM

## 2023-10-17 DIAGNOSIS — E1165 Type 2 diabetes mellitus with hyperglycemia: Secondary | ICD-10-CM | POA: Diagnosis not present

## 2023-10-17 DIAGNOSIS — Z7984 Long term (current) use of oral hypoglycemic drugs: Secondary | ICD-10-CM | POA: Diagnosis not present

## 2023-10-17 DIAGNOSIS — Z79899 Other long term (current) drug therapy: Secondary | ICD-10-CM | POA: Diagnosis not present

## 2023-10-17 DIAGNOSIS — Z8639 Personal history of other endocrine, nutritional and metabolic disease: Secondary | ICD-10-CM

## 2023-10-17 LAB — BASIC METABOLIC PANEL
Anion gap: 7 (ref 5–15)
BUN: 12 mg/dL (ref 6–20)
CO2: 24 mmol/L (ref 22–32)
Calcium: 8.9 mg/dL (ref 8.9–10.3)
Chloride: 101 mmol/L (ref 98–111)
Creatinine, Ser: 0.76 mg/dL (ref 0.61–1.24)
GFR, Estimated: >60 mL/min (ref >60)
Glucose, Bld: 301 mg/dL — ABNORMAL HIGH (ref 70–99)
Potassium: 4.1 mmol/L (ref 3.5–5.1)
Sodium: 132 mmol/L — ABNORMAL LOW (ref 135–145)

## 2023-10-17 LAB — CBC
HCT: 38.9 % — ABNORMAL LOW (ref 39.0–52.0)
Hemoglobin: 13.7 g/dL (ref 13.0–17.0)
MCH: 32.5 pg (ref 26.0–34.0)
MCHC: 35.2 g/dL (ref 30.0–36.0)
MCV: 92.2 fL (ref 80.0–100.0)
Platelets: 182 10*3/uL (ref 150–400)
RBC: 4.22 MIL/uL (ref 4.22–5.81)
RDW: 11.9 % (ref 11.5–15.5)
WBC: 5.7 10*3/uL (ref 4.0–10.5)
nRBC: 0 % (ref 0.0–0.2)

## 2023-10-17 LAB — CBG MONITORING, ED
Glucose-Capillary: 340 mg/dL — ABNORMAL HIGH (ref 70–99)
Glucose-Capillary: 258 mg/dL — ABNORMAL HIGH (ref 70–99)

## 2023-10-17 MED ORDER — INSULIN ASPART 100 UNIT/ML IJ SOLN
6.0000 [IU] | Freq: Once | INTRAMUSCULAR | Status: AC
  Administered 2023-10-17: 6 [IU] via SUBCUTANEOUS

## 2023-10-17 MED ORDER — SODIUM CHLORIDE 0.9 % IV BOLUS
1000.0000 mL | Freq: Once | INTRAVENOUS | Status: AC
  Administered 2023-10-17: 1000 mL via INTRAVENOUS

## 2023-10-17 NOTE — Discharge Instructions (Addendum)
 It was our pleasure to provide your ER care today - we hope that you feel better.  Drink plenty of water/ stay well hydrated. Follow diabetes eating/meal plan.   Continue your metformin and glipizide. Take metformin 1000 mg 2x/day. Take glipizide 10 mg a day.   Check your glucose level 4x/day (before meals and at bedtime), and record values.   Follow up with your doctor in the coming week and bring a record of your blood glucose readings with you to the appointment. Discuss possible further medication adjustment or insulin therapy then.   Return to ER if worse, new symptoms, fevers, vomiting, weak/fainting, or other concern.

## 2023-10-17 NOTE — ED Triage Notes (Addendum)
 Reports that his blood sugar is elevated and he can't get it down. Pt states that he has been taking his metformin and it has been not coming down. Pt also reports that his lips feel numb. Pt GCS is 15. Neuro intact. No deficit.

## 2023-10-17 NOTE — ED Provider Notes (Signed)
 [place] [facility] Provider Note   CSN: [id number] Arrival date & time: 10/17/23  1026     History  Chief Complaint  Patient presents with   Hyperglycemia    Maurice Turner is a 58 y.o. male.  Pt with hx niddm, c/o blood sugars being high in 300 range in past week. Indicates compliant w his meds, metformin and glipizide. Denies change in meds/doses. No fever or chills. No dysuria. No nausea/vomiting.  No abd pain. No faintness or dizziness. Is eating/drinking, no wt loss.   The history is provided by the patient and medical records.  Hyperglycemia Associated symptoms: polyuria   Associated symptoms: no abdominal pain, no chest pain, no dysuria, no fever, no shortness of breath and no vomiting        Home Medications Prior to Admission medications   Medication Sig Start Date End Date Taking? Authorizing Provider  cyclobenzaprine (FLEXERIL) 10 MG tablet One half tab PO qHS, then increase gradually to one tab TID. 08/16/19   Myra Rude, MD  emtricitabine-rilpivir-tenofovir AF (ODEFSEY) 200-25-25 MG TABS tablet Take 1 tablet by mouth daily. 11/26/22   [provider]  famotidine (PEPCID) 20 MG tablet Take 1 tablet (20 mg total) by mouth daily. 07/18/22   Elayne Snare K, DO  glipiZIDE (GLUCOTROL) 5 MG tablet Take 1 tablet (5 mg total) by mouth daily before breakfast. 12/17/22   Molpus, John, MD  HYDROcodone-acetaminophen (NORCO/VICODIN) 5-325 MG tablet Take 1 tablet by mouth every 8 (eight) hours as needed for moderate pain. 09/26/19   Myra Rude, MD  lisinopril (PRINIVIL,ZESTRIL) 10 MG tablet Take 10 mg by mouth. 12/12/15 05/29/19  [provider]  metFORMIN (GLUCOPHAGE) 500 MG tablet Take 500 mg by mouth. 06/02/16   [provider]  valACYclovir (VALTREX) 1000 MG tablet TAKE 1 TABLET BY MOUTH DAILY 08/19/16   [provider]      Allergies    Patient has no known allergies.    Review of  Systems   Review of Systems  Constitutional:  Negative for chills and fever.  HENT:  Negative for sore throat.   Respiratory:  Negative for shortness of breath.   Cardiovascular:  Negative for chest pain.  Gastrointestinal:  Negative for abdominal pain, diarrhea and vomiting.  Endocrine: Positive for polyuria.  Genitourinary:  Negative for dysuria.  Musculoskeletal:  Negative for back pain and neck pain.  Neurological:  Negative for headaches.    Physical Exam Updated Vital Signs BP 125/63   Pulse 85   Temp 98.7 F (37.1 C) (Oral)   Resp 16   Ht 1.829 m (6')   Wt 95.3 kg   SpO2 98%   BMI 28.48 kg/m  Physical Exam Vitals and nursing note reviewed.  Constitutional:      Appearance: Normal appearance. He is well-developed.  HENT:     Head: Atraumatic.     Nose: Nose normal.     Mouth/Throat:     Mouth: Mucous membranes are moist.     Pharynx: Oropharynx is clear.  Eyes:     General: No scleral icterus.    Conjunctiva/sclera: Conjunctivae normal.  Neck:     Trachea: No tracheal deviation.  Cardiovascular:     Rate and Rhythm: Normal rate and regular rhythm.     Pulses: Normal pulses.     Heart sounds: Normal heart sounds. No murmur heard.    No friction rub. No gallop.  Pulmonary:     Effort: Pulmonary  effort is normal. No accessory muscle usage or respiratory distress.     Breath sounds: Normal breath sounds.  Abdominal:     General: There is no distension.     Palpations: Abdomen is soft.     Tenderness: There is no abdominal tenderness.  Musculoskeletal:        General: No swelling.     Cervical back: Neck supple.  Skin:    General: Skin is warm and dry.     Findings: No rash.  Neurological:     Mental Status: He is alert.     Comments: Alert, speech clear.   Psychiatric:        Mood and Affect: Mood normal.     ED Results / Procedures / Treatments   Labs (all labs ordered are listed, but only abnormal results are displayed) Results for orders  placed or performed during the hospital encounter of 10/17/23  POC CBG, ED   Collection Time: 10/17/23 10:40 AM  Result Value Ref Range   Glucose-Capillary 340 (H) 70 - 99 mg/dL  Basic metabolic panel   Collection Time: 10/17/23 11:25 AM  Result Value Ref Range   Sodium 132 (L) 135 - 145 mmol/L   Potassium 4.1 3.5 - 5.1 mmol/L   Chloride 101 98 - 111 mmol/L   CO2 24 22 - 32 mmol/L   Glucose, Bld 301 (H) 70 - 99 mg/dL   BUN 12 6 - 20 mg/dL   Creatinine, Ser 1.61 0.61 - 1.24 mg/dL   Calcium 8.9 8.9 - 09.6 mg/dL   GFR, Estimated >04 >54 mL/min   Anion gap 7 5 - 15  CBC   Collection Time: 10/17/23 11:25 AM  Result Value Ref Range   WBC 5.7 4.0 - 10.5 K/uL   RBC 4.22 4.22 - 5.81 MIL/uL   Hemoglobin 13.7 13.0 - 17.0 g/dL   HCT 09.8 (L) 11.9 - 14.7 %   MCV 92.2 80.0 - 100.0 fL   MCH 32.5 26.0 - 34.0 pg   MCHC 35.2 30.0 - 36.0 g/dL   RDW 82.9 56.2 - 13.0 %   Platelets 182 150 - 400 K/uL   nRBC 0.0 0.0 - 0.2 %  POC CBG, ED   Collection Time: 10/17/23  1:04 PM  Result Value Ref Range   Glucose-Capillary 258 (H) 70 - 99 mg/dL    EKG None  Radiology No results found.  Procedures Procedures    Medications Ordered in ED Medications  sodium chloride 0.9 % bolus 1,000 mL (1,000 mLs Intravenous New Bag/Given 10/17/23 1127)  insulin aspart (novoLOG) injection 6 Units (6 Units Subcutaneous Given 10/17/23 1221)    ED Course/ Medical Decision Making/ A&P                                 Medical Decision Making Problems Addressed: History of diabetes mellitus, type II: chronic illness or injury that poses a threat to life or bodily functions Hyperglycemia: acute illness or injury with systemic symptoms  Amount and/or Complexity of Data Reviewed External Data Reviewed: notes. Labs: ordered. Decision-making details documented in ED Course.  Risk Prescription drug management. Decision regarding hospitalization.   Iv ns.  Labs ordered/sent. Imaging ordered.   Differential  diagnosis includes dka, hyperglycemia, dehydration, etc. Dispo decision including potential need for admission considered - will get labs and reassess.   Reviewed nursing notes and prior charts for additional history. External reports reviewed.  NS bolus. Novolog sq.   Labs reviewed/interpreted by me - glucose high. Hco3 normal.   Recheck, glucose improved. Indicates feels fine, no new c/o.  Will increase metformin from 500 mg bid to 1000 mg bid. Continue glipizide. Close pcp f/u this week.   Pt appears stable for d/c.   Rec close pcp f/u.  Return precautions provided.          Final Clinical Impression(s) / ED Diagnoses Final diagnoses:  Hyperglycemia  History of diabetes mellitus, type II    Rx / DC Orders ED Discharge Orders     None         Cathren Laine, MD 10/17/23 1318

## 2023-12-24 ENCOUNTER — Other Ambulatory Visit: Payer: Self-pay

## 2023-12-24 ENCOUNTER — Encounter (HOSPITAL_BASED_OUTPATIENT_CLINIC_OR_DEPARTMENT_OTHER): Payer: Self-pay | Admitting: Emergency Medicine

## 2023-12-24 DIAGNOSIS — K6289 Other specified diseases of anus and rectum: Secondary | ICD-10-CM | POA: Insufficient documentation

## 2023-12-24 DIAGNOSIS — A64 Unspecified sexually transmitted disease: Secondary | ICD-10-CM | POA: Diagnosis not present

## 2023-12-24 DIAGNOSIS — N4889 Other specified disorders of penis: Secondary | ICD-10-CM | POA: Insufficient documentation

## 2023-12-24 DIAGNOSIS — Z21 Asymptomatic human immunodeficiency virus [HIV] infection status: Secondary | ICD-10-CM | POA: Diagnosis not present

## 2023-12-24 DIAGNOSIS — E119 Type 2 diabetes mellitus without complications: Secondary | ICD-10-CM | POA: Diagnosis not present

## 2023-12-24 DIAGNOSIS — I10 Essential (primary) hypertension: Secondary | ICD-10-CM | POA: Diagnosis not present

## 2023-12-24 DIAGNOSIS — Z79899 Other long term (current) drug therapy: Secondary | ICD-10-CM | POA: Insufficient documentation

## 2023-12-24 DIAGNOSIS — Z7984 Long term (current) use of oral hypoglycemic drugs: Secondary | ICD-10-CM | POA: Diagnosis not present

## 2023-12-24 NOTE — ED Triage Notes (Signed)
 Patient coming to ED for evaluation of possible STD.  Reports "it is burning really bad down there.  The rectum is burning too.  There are no bumps or discharge but I know something is not right."  Symptoms started 2 days ago.  Has had prior STD and states "it feels the same."  No reports of fevers.

## 2023-12-25 ENCOUNTER — Emergency Department (HOSPITAL_BASED_OUTPATIENT_CLINIC_OR_DEPARTMENT_OTHER)
Admission: EM | Admit: 2023-12-25 | Discharge: 2023-12-25 | Disposition: A | Discharge status: Home / Self Care | Attending: Emergency Medicine | Admitting: Emergency Medicine

## 2023-12-25 DIAGNOSIS — R208 Other disturbances of skin sensation: Secondary | ICD-10-CM

## 2023-12-25 LAB — URINALYSIS, ROUTINE W REFLEX MICROSCOPIC
Bilirubin Urine: NEGATIVE
Glucose, UA: >=500 mg/dL — AB
Hgb urine dipstick: NEGATIVE
Ketones, ur: 15 mg/dL — AB
Leukocytes,Ua: NEGATIVE
Nitrite: NEGATIVE
Protein, ur: NEGATIVE mg/dL
Specific Gravity, Urine: 1.025 (ref 1.005–1.030)
pH: 5.5 (ref 5.0–8.0)

## 2023-12-25 LAB — URINALYSIS, MICROSCOPIC (REFLEX)

## 2023-12-25 MED ORDER — VALACYCLOVIR HCL 1 G PO TABS: 1000.0000 mg | ORAL_TABLET | Freq: Two times a day (BID) | ORAL | 0 refills | Status: AC

## 2023-12-25 MED ORDER — LIDOCAINE HCL URETHRAL/MUCOSAL 2 % EX GEL
1.0000 | Freq: Once | CUTANEOUS | Status: AC
  Administered 2023-12-25: 1 via TOPICAL
  Filled 2023-12-25: qty 11

## 2023-12-25 NOTE — ED Provider Notes (Signed)
 [facility] Provider Note   CSN: [id number] Arrival date & time: 12/24/23  2321     History  Chief Complaint  Patient presents with   SEXUALLY TRANSMITTED DISEASE    Maurice Turner is a 58 y.o. male.  The history is provided by the patient.   Maurice Turner is a 58 y.o. male who presents to the Emergency Department complaining of concern for STD.  He presents to the emergency department for evaluation of rectal and penile burning for the last 3 days.  No associate fever, nausea, vomiting, abdominal pain.  He has pain when he wipes.  He is sexually active with a single male partner, last sexual activity was 1 week ago.  He has HIV that is controlled with medication, hypertension, diabetes.    Home Medications Prior to Admission medications   Medication Sig Start Date End Date Taking? Authorizing Provider  valACYclovir (VALTREX) 1000 MG tablet Take 1 tablet (1,000 mg total) by mouth 2 (two) times daily for 7 days. 12/25/23 01/01/24 Yes Kelsey Patricia, MD  cyclobenzaprine  (FLEXERIL ) 10 MG tablet One half tab PO qHS, then increase gradually to one tab TID. 08/16/19   Schmitz, Jeremy E, MD  emtricitabine-rilpivir-tenofovir AF (ODEFSEY) 200-25-25 MG TABS tablet Take 1 tablet by mouth daily. 11/26/22   [provider]  famotidine  (PEPCID ) 20 MG tablet Take 1 tablet (20 mg total) by mouth daily. 07/18/22   Kingsley, Victoria K, DO  glipiZIDE  (GLUCOTROL ) 5 MG tablet Take 1 tablet (5 mg total) by mouth daily before breakfast. 12/17/22   Molpus, John, MD  HYDROcodone -acetaminophen  (NORCO/VICODIN) 5-325 MG tablet Take 1 tablet by mouth every 8 (eight) hours as needed for moderate pain. 09/26/19   Margaree Shark, MD  lisinopril (PRINIVIL,ZESTRIL) 10 MG tablet Take 10 mg by mouth. 12/12/15 05/29/19  [provider]  metFORMIN  (GLUCOPHAGE ) 500 MG tablet Take 500 mg by mouth. 06/02/16   [provider]      Allergies    Patient  has no known allergies.    Review of Systems   Review of Systems  All other systems reviewed and are negative.   Physical Exam Updated Vital Signs BP (!) 167/90 (BP Location: Right Arm)   Pulse 76   Temp 98 F (36.7 C) (Oral)   Resp 18   Ht 6\' 1"  (1.854 m)   Wt 97.5 kg   SpO2 97%   BMI 28.37 kg/m  Physical Exam Vitals and nursing note reviewed.  Constitutional:      Appearance: He is well-developed.  HENT:     Head: Normocephalic and atraumatic.  Cardiovascular:     Rate and Rhythm: Normal rate and regular rhythm.  Pulmonary:     Effort: Pulmonary effort is normal. No respiratory distress.  Abdominal:     Palpations: Abdomen is soft.     Tenderness: There is no abdominal tenderness. There is no guarding or rebound.  Genitourinary:    Penis: Normal.      Testes: Normal.     Rectum: Normal.  Musculoskeletal:        General: No tenderness.  Skin:    General: Skin is warm and dry.  Neurological:     Mental Status: He is alert and oriented to person, place, and time.  Psychiatric:        Behavior: Behavior normal.     ED Results / Procedures / Treatments   Labs (all labs ordered are listed, but only abnormal results are  displayed) Labs Reviewed  URINALYSIS, ROUTINE W REFLEX MICROSCOPIC - Abnormal; Notable for the following components:      Result Value   Glucose, UA >=500 (*)    Ketones, ur 15 (*)    All other components within normal limits  URINALYSIS, MICROSCOPIC (REFLEX) - Abnormal; Notable for the following components:   Bacteria, UA RARE (*)    All other components within normal limits  RPR  GC/CHLAMYDIA PROBE AMP ([place]) NOT AT [facility]    EKG None  Radiology No results found.  Procedures Procedures    Medications Ordered in ED Medications  lidocaine  (XYLOCAINE ) 2 % jelly 1 Application (1 Application Topical Given 12/25/23 0118)    ED Course/ Medical Decision Making/ A&P                                 Medical Decision Making Amount  and/or Complexity of Data Reviewed Labs: ordered.  Risk Prescription drug management.   Patient here for evaluation of rectal and penile burning for the last several days.  There are no discrete ulcers or lesions on examination.  On record review he did have similar episode in the past and he had HSV at that time.  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  Will start on treatment.  No sores consistent with syphilis at this time, will send RPR.  Discussed local care for his symptoms.  Will provide Uro-Jet.  Discussed outpatient follow-up and return precautions.        Final Clinical Impression(s) / ED Diagnoses Final diagnoses:  Rectal burning    Rx / DC Orders ED Discharge Orders          Ordered    valACYclovir (VALTREX) 1000 MG tablet  2 times daily        12/25/23 0057              Kelsey Patricia, MD 12/25/23 [phone]

## 2023-12-26 LAB — RPR: RPR Ser Ql: NONREACTIVE

## 2023-12-27 LAB — GC/CHLAMYDIA PROBE AMP (~~LOC~~) NOT AT ARMC
Chlamydia: NEGATIVE
Comment: NEGATIVE
Comment: NORMAL
Neisseria Gonorrhea: NEGATIVE

## 2024-05-03 ENCOUNTER — Encounter (HOSPITAL_BASED_OUTPATIENT_CLINIC_OR_DEPARTMENT_OTHER): Payer: Self-pay

## 2024-05-03 ENCOUNTER — Emergency Department (HOSPITAL_BASED_OUTPATIENT_CLINIC_OR_DEPARTMENT_OTHER)
Admission: EM | Admit: 2024-05-03 | Discharge: 2024-05-03 | Discharge status: Against Medical Advice | Payer: Self-pay | Attending: Emergency Medicine | Admitting: Emergency Medicine

## 2024-05-03 ENCOUNTER — Other Ambulatory Visit: Payer: Self-pay

## 2024-05-03 DIAGNOSIS — Z113 Encounter for screening for infections with a predominantly sexual mode of transmission: Secondary | ICD-10-CM | POA: Insufficient documentation

## 2024-05-03 DIAGNOSIS — Z5321 Procedure and treatment not carried out due to patient leaving prior to being seen by health care provider: Secondary | ICD-10-CM | POA: Insufficient documentation

## 2024-05-03 NOTE — ED Triage Notes (Addendum)
 Pt hear to be checked for STDs Orally, rectally and penile Last sexual encounter 2 weeks ago

## 2024-05-05 LAB — GC/CHLAMYDIA PROBE AMP (~~LOC~~) NOT AT ARMC
Chlamydia: NEGATIVE
Chlamydia: NEGATIVE
Chlamydia: NEGATIVE
Comment: NEGATIVE
Comment: NEGATIVE
Comment: NEGATIVE
Comment: NORMAL
Comment: NORMAL
Comment: NORMAL
Neisseria Gonorrhea: NEGATIVE
Neisseria Gonorrhea: NEGATIVE
Neisseria Gonorrhea: NEGATIVE

## 2024-09-24 DEATH — deceased
